# Patient Record
Sex: Male | Born: 1955 | Race: Black or African American | Hispanic: No | Smoking: Never smoker
Health system: Southern US, Community
[De-identification: ages and names within clinical notes are randomized; demographics above are authoritative.]

## PROBLEM LIST (undated history)

## (undated) DIAGNOSIS — I10 Essential (primary) hypertension: Secondary | ICD-10-CM

## (undated) DIAGNOSIS — K429 Umbilical hernia without obstruction or gangrene: Secondary | ICD-10-CM

## (undated) DIAGNOSIS — K4090 Unilateral inguinal hernia, without obstruction or gangrene, not specified as recurrent: Secondary | ICD-10-CM

## (undated) DIAGNOSIS — R112 Nausea with vomiting, unspecified: Secondary | ICD-10-CM

## (undated) DIAGNOSIS — I209 Angina pectoris, unspecified: Secondary | ICD-10-CM

## (undated) DIAGNOSIS — M1990 Unspecified osteoarthritis, unspecified site: Secondary | ICD-10-CM

## (undated) DIAGNOSIS — Z9889 Other specified postprocedural states: Secondary | ICD-10-CM

## (undated) DIAGNOSIS — N200 Calculus of kidney: Secondary | ICD-10-CM

## (undated) DIAGNOSIS — E785 Hyperlipidemia, unspecified: Secondary | ICD-10-CM

## (undated) HISTORY — PX: KNEE SURGERY: SHX244

## (undated) HISTORY — PX: HERNIA REPAIR: SHX51

## (undated) HISTORY — PX: COLONOSCOPY: SHX174

## (undated) HISTORY — PX: OTHER SURGICAL HISTORY: SHX169

## (undated) HISTORY — PX: BUNIONECTOMY: SHX129

---

## 2007-02-24 HISTORY — PX: NM MYOVIEW LTD: HXRAD82

## 2007-03-07 ENCOUNTER — Ambulatory Visit (HOSPITAL_COMMUNITY): Admission: RE | Admit: 2007-03-07 | Discharge: 2007-03-07 | Payer: Self-pay | Admitting: Interventional Cardiology

## 2009-12-24 ENCOUNTER — Emergency Department (HOSPITAL_COMMUNITY)
Admission: EM | Admit: 2009-12-24 | Discharge: 2009-12-24 | Payer: Self-pay | Source: Home / Self Care | Admitting: Emergency Medicine

## 2010-02-21 ENCOUNTER — Emergency Department (HOSPITAL_COMMUNITY)
Admission: EM | Admit: 2010-02-21 | Discharge: 2010-02-21 | Payer: Self-pay | Source: Home / Self Care | Admitting: Emergency Medicine

## 2010-02-21 LAB — CBC
HCT: 40.1 % (ref 39.0–52.0)
Hemoglobin: 12.7 g/dL — ABNORMAL LOW (ref 13.0–17.0)
MCV: 86.6 fL (ref 78.0–100.0)
RDW: 13.6 % (ref 11.5–15.5)
WBC: 10.8 10*3/uL — ABNORMAL HIGH (ref 4.0–10.5)

## 2010-02-21 LAB — PROTIME-INR
INR: 0.94 (ref 0.00–1.49)
Prothrombin Time: 12.8 seconds (ref 11.6–15.2)

## 2010-02-21 LAB — TYPE AND SCREEN: Antibody Screen: NEGATIVE

## 2010-02-21 LAB — DIFFERENTIAL
Basophils Absolute: 0 10*3/uL (ref 0.0–0.1)
Eosinophils Relative: 2 % (ref 0–5)
Lymphocytes Relative: 36 % (ref 12–46)
Lymphs Abs: 3.9 10*3/uL (ref 0.7–4.0)
Neutro Abs: 6.1 10*3/uL (ref 1.7–7.7)

## 2010-02-21 LAB — BASIC METABOLIC PANEL
Chloride: 109 mEq/L (ref 96–112)
GFR calc Af Amer: >60 mL/min (ref >60)
GFR calc non Af Amer: 54 mL/min — ABNORMAL LOW (ref >60)
Potassium: 4 mEq/L (ref 3.5–5.1)
Sodium: 143 mEq/L (ref 135–145)

## 2010-02-21 LAB — OCCULT BLOOD, POC DEVICE: Fecal Occult Bld: POSITIVE

## 2010-02-21 LAB — ABO/RH: ABO/RH(D): O POS

## 2010-04-05 LAB — DIFFERENTIAL
Eosinophils Absolute: 0.3 10*3/uL (ref 0.0–0.7)
Eosinophils Relative: 3 % (ref 0–5)
Lymphocytes Relative: 28 % (ref 12–46)
Lymphs Abs: 2.8 10*3/uL (ref 0.7–4.0)
Monocytes Absolute: 0.6 10*3/uL (ref 0.1–1.0)

## 2010-04-05 LAB — URINALYSIS, ROUTINE W REFLEX MICROSCOPIC
Bilirubin Urine: NEGATIVE
Ketones, ur: NEGATIVE mg/dL
Leukocytes, UA: NEGATIVE
Nitrite: NEGATIVE
Protein, ur: NEGATIVE mg/dL
pH: 5.5 (ref 5.0–8.0)

## 2010-04-05 LAB — CBC
HCT: 40.4 % (ref 39.0–52.0)
MCH: 29.1 pg (ref 26.0–34.0)
MCV: 85.8 fL (ref 78.0–100.0)
RBC: 4.71 MIL/uL (ref 4.22–5.81)
RDW: 13.6 % (ref 11.5–15.5)
WBC: 9.9 10*3/uL (ref 4.0–10.5)

## 2010-04-05 LAB — URINE MICROSCOPIC-ADD ON

## 2010-04-05 LAB — BASIC METABOLIC PANEL
BUN: 19 mg/dL (ref 6–23)
CO2: 26 mEq/L (ref 19–32)
Chloride: 110 mEq/L (ref 96–112)
Creatinine, Ser: 1.74 mg/dL — ABNORMAL HIGH (ref 0.4–1.5)
Potassium: 4.3 mEq/L (ref 3.5–5.1)

## 2014-09-25 ENCOUNTER — Ambulatory Visit: Payer: Self-pay | Admitting: Physician Assistant

## 2014-09-25 NOTE — H&P (Signed)
TOTAL KNEE ADMISSION H&P  Patient is being admitted for right total knee arthroplasty.  Subjective:  Chief Complaint:right knee pain.  HPI: David Stephens, 59 y.o. male, has a history of pain and functional disability in the right knee due to arthritis and has failed non-surgical conservative treatments for greater than 12 weeks to includeNSAID's and/or analgesics, corticosteriod injections and activity modification.  Onset of symptoms was gradual, starting >10 years ago with gradually worsening course since that time. The patient noted prior procedures on the knee to include  arthroscopy, menisectomy and ACL reconstruction on the right knee(s).  Patient currently rates pain in the right knee(s) at 8 out of 10 with activity. Patient has night pain, worsening of pain with activity and weight bearing, pain that interferes with activities of daily living, pain with passive range of motion, crepitus and joint swelling.  Patient has evidence of periarticular osteophytes and joint space narrowing by imaging studies.  There is no active infection.  There are no active problems to display for this patient.  No past medical history on file.  No past surgical history on file.   (Not in a hospital admission) Allergies not on file  Social History  Substance Use Topics  . Smoking status: Not on file  . Smokeless tobacco: Not on file  . Alcohol Use: Not on file    No family history on file.   Review of Systems  Musculoskeletal: Positive for joint pain. Negative for falls.  All other systems reviewed and are negative.   Objective:  Physical Exam  Constitutional: He is oriented to person, place, and time. He appears well-developed and well-nourished. No distress.  HENT:  Head: Normocephalic and atraumatic.  Nose: Nose normal.  Eyes: Conjunctivae and EOM are normal. Pupils are equal, round, and reactive to light.  Neck: Normal range of motion. Neck supple.  Cardiovascular: Normal rate,  regular rhythm, normal heart sounds and intact distal pulses.   Respiratory: Effort normal and breath sounds normal. No respiratory distress. He has no wheezes. He has no rales. He exhibits no tenderness.  GI: Soft. Bowel sounds are normal. He exhibits no distension. There is no tenderness.  Musculoskeletal:       Right knee: He exhibits decreased range of motion. He exhibits no LCL laxity and no MCL laxity. Tenderness found.  Lymphadenopathy:    He has no cervical adenopathy.  Neurological: He is alert and oriented to person, place, and time. No cranial nerve deficit.  Skin: Skin is warm and dry. No rash noted. No erythema.  Psychiatric: He has a normal mood and affect. His behavior is normal.    Vital signs in last 24 hours: @  Labs:   There is no height or weight on file to calculate BMI.   Imaging Review Plain radiographs demonstrate severe degenerative joint disease of the right knee(s). The overall alignment isneutral. The bone quality appears to be good for age and reported activity level. Staples proximal tibia and distal femur from previous ACL.  Assessment/Plan:  End stage arthritis, right knee   The patient history, physical examination, clinical judgment of the provider and imaging studies are consistent with end stage degenerative joint disease of the right knee(s) and total knee arthroplasty is deemed medically necessary. The treatment options including medical management, injection therapy arthroscopy and arthroplasty were discussed at length. The risks and benefits of total knee arthroplasty were presented and reviewed. The risks due to aseptic loosening, infection, stiffness, patella tracking problems, thromboembolic complications and other  imponderables were discussed. The patient acknowledged the explanation, agreed to proceed with the plan and consent was signed. Patient is being admitted for inpatient treatment for surgery, pain control, PT, OT, prophylactic  antibiotics, VTE prophylaxis, progressive ambulation and ADL's and discharge planning. The patient is planning to be discharged home with home health services

## 2014-09-29 ENCOUNTER — Encounter (HOSPITAL_COMMUNITY): Payer: Self-pay

## 2014-09-29 ENCOUNTER — Encounter (HOSPITAL_COMMUNITY)
Admission: RE | Admit: 2014-09-29 | Discharge: 2014-09-29 | Disposition: A | Discharge status: Home / Self Care | Payer: 59 | Source: Ambulatory Visit | Attending: Orthopedic Surgery | Admitting: Orthopedic Surgery

## 2014-09-29 ENCOUNTER — Ambulatory Visit (HOSPITAL_COMMUNITY)
Admission: RE | Admit: 2014-09-29 | Discharge: 2014-09-29 | Disposition: A | Discharge status: Home / Self Care | Payer: 59 | Source: Ambulatory Visit | Attending: Physician Assistant | Admitting: Physician Assistant

## 2014-09-29 DIAGNOSIS — Z01818 Encounter for other preprocedural examination: Secondary | ICD-10-CM | POA: Insufficient documentation

## 2014-09-29 DIAGNOSIS — M1711 Unilateral primary osteoarthritis, right knee: Secondary | ICD-10-CM

## 2014-09-29 DIAGNOSIS — Z01812 Encounter for preprocedural laboratory examination: Secondary | ICD-10-CM | POA: Diagnosis not present

## 2014-09-29 HISTORY — DX: Unspecified osteoarthritis, unspecified site: M19.90

## 2014-09-29 HISTORY — DX: Calculus of kidney: N20.0

## 2014-09-29 HISTORY — DX: Umbilical hernia without obstruction or gangrene: K42.9

## 2014-09-29 HISTORY — DX: Hyperlipidemia, unspecified: E78.5

## 2014-09-29 HISTORY — DX: Other specified postprocedural states: R11.2

## 2014-09-29 HISTORY — DX: Unilateral inguinal hernia, without obstruction or gangrene, not specified as recurrent: K40.90

## 2014-09-29 HISTORY — DX: Nausea with vomiting, unspecified: Z98.890

## 2014-09-29 HISTORY — DX: Angina pectoris, unspecified: I20.9

## 2014-09-29 LAB — COMPREHENSIVE METABOLIC PANEL
ALK PHOS: 75 U/L (ref 38–126)
ALT: 21 U/L (ref 17–63)
AST: 23 U/L (ref 15–41)
Albumin: 4 g/dL (ref 3.5–5.0)
Anion gap: 6 (ref 5–15)
BUN: 21 mg/dL — ABNORMAL HIGH (ref 6–20)
CALCIUM: 9.4 mg/dL (ref 8.9–10.3)
CO2: 23 mmol/L (ref 22–32)
CREATININE: 1.67 mg/dL — AB (ref 0.61–1.24)
Chloride: 111 mmol/L (ref 101–111)
GFR calc non Af Amer: 43 mL/min — ABNORMAL LOW (ref >60)
GFR, EST AFRICAN AMERICAN: 50 mL/min — AB (ref >60)
GLUCOSE: 94 mg/dL (ref 65–99)
Potassium: 3.8 mmol/L (ref 3.5–5.1)
SODIUM: 140 mmol/L (ref 135–145)
Total Bilirubin: 0.6 mg/dL (ref 0.3–1.2)
Total Protein: 7.5 g/dL (ref 6.5–8.1)

## 2014-09-29 LAB — PROTIME-INR
INR: 1.1 (ref 0.00–1.49)
Prothrombin Time: 14.4 seconds (ref 11.6–15.2)

## 2014-09-29 LAB — CBC WITH DIFFERENTIAL/PLATELET
Basophils Absolute: 0 10*3/uL (ref 0.0–0.1)
Basophils Relative: 0 % (ref 0–1)
EOS ABS: 0.2 10*3/uL (ref 0.0–0.7)
Eosinophils Relative: 3 % (ref 0–5)
HCT: 42.5 % (ref 39.0–52.0)
HEMOGLOBIN: 14.2 g/dL (ref 13.0–17.0)
LYMPHS ABS: 2.2 10*3/uL (ref 0.7–4.0)
LYMPHS PCT: 29 % (ref 12–46)
MCH: 29.1 pg (ref 26.0–34.0)
MCHC: 33.4 g/dL (ref 30.0–36.0)
MCV: 87.1 fL (ref 78.0–100.0)
Monocytes Absolute: 0.4 10*3/uL (ref 0.1–1.0)
Monocytes Relative: 5 % (ref 3–12)
NEUTROS ABS: 4.8 10*3/uL (ref 1.7–7.7)
NEUTROS PCT: 63 % (ref 43–77)
Platelets: 158 10*3/uL (ref 150–400)
RBC: 4.88 MIL/uL (ref 4.22–5.81)
RDW: 13.6 % (ref 11.5–15.5)
WBC: 7.7 10*3/uL (ref 4.0–10.5)

## 2014-09-29 LAB — URINALYSIS, ROUTINE W REFLEX MICROSCOPIC
Bilirubin Urine: NEGATIVE
Glucose, UA: NEGATIVE mg/dL
Hgb urine dipstick: NEGATIVE
Ketones, ur: NEGATIVE mg/dL
LEUKOCYTES UA: NEGATIVE
NITRITE: NEGATIVE
PROTEIN: NEGATIVE mg/dL
Specific Gravity, Urine: 1.019 (ref 1.005–1.030)
UROBILINOGEN UA: 0.2 mg/dL (ref 0.0–1.0)
pH: 5 (ref 5.0–8.0)

## 2014-09-29 LAB — TYPE AND SCREEN
ABO/RH(D): O POS
Antibody Screen: NEGATIVE

## 2014-09-29 LAB — APTT: aPTT: 33 seconds (ref 24–37)

## 2014-09-29 LAB — SURGICAL PCR SCREEN
MRSA, PCR: NEGATIVE
STAPHYLOCOCCUS AUREUS: NEGATIVE

## 2014-09-29 NOTE — Progress Notes (Signed)
Patient's PCP is [facility] off [address]  Patient denies currently seeing a cardiologist.  EKG: requested from [facility] (2 weeks ago per pt)  ECHO: denies  Stress test:03/07/07  Cath: denies

## 2014-09-29 NOTE — Pre-Procedure Instructions (Signed)
    David Stephens  09/29/2014     No Pharmacies Listed   Your procedure is scheduled on Friday October 09 2014 at 1045 a.m.  Report to [facility] Admitting at 845 A.M.  Call this number if you have problems the morning of surgery:  [phone]  Call this number if you have problems in the days leading up to your surgery:  [phone]    Remember:  Do not eat food or drink liquids after midnight Thursday October 08 2014.  Take these medicines the morning of surgery with A SIP OF WATER   Do not wear jewelry, make-up or nail polish.  Do not wear lotions, powders, or perfumes.  You may wear deodorant.  Do not shave 48 hours prior to surgery.  Men may shave face and neck.  Do not bring valuables to the hospital.  [facility] is not responsible for any belongings or valuables.  Contacts, dentures or bridgework may not be worn into surgery.  Leave your suitcase in the car.  After surgery it may be brought to your room.  For patients admitted to the hospital, discharge time will be determined by your treatment team.  Patients discharged the day of surgery will not be allowed to drive home.   Special instructions:  Please follow these instructions carefully:  1. Shower with CHG Soap the night before surgery and the morning of Surgery. 2. If you choose to wash your hair, wash your hair first as usual with your normal shampoo. 3. After you shampoo, rinse your hair and body thoroughly to remove the Shampoo. 4. Use CHG as you would any other liquid soap. You can apply chg directly to the skin and wash gently with scrungie or a clean washcloth. 5. Apply the CHG Soap to your body ONLY FROM THE NECK DOWN. Do not use on open wounds or open sores. Avoid contact with your eyes, ears, mouth and genitals (private parts). Wash genitals (private parts) with your normal soap. 6. Wash thoroughly, paying  special attention to the area where your surgery will be performed. 7. Thoroughly rinse your body with warm water from the neck down. 8. DO NOT shower/wash with your normal soap after using and rinsing off the CHG Soap. 9. Pat yourself dry with a clean towel.  10. Wear clean pajamas.  11. Place clean sheets on your bed the night of your first shower and do not sleep with pets.  Day of Surgery  Do not apply any lotions/deodorants the morning of surgery. Please wear clean clothes to the hospital/surgery center.    Please read over the following fact sheets that you were given. Pain Booklet, Coughing and Deep Breathing, Blood Transfusion Information, MRSA Information and Surgical Site Infection Prevention

## 2014-09-30 LAB — URINE CULTURE: Culture: 20000

## 2014-09-30 NOTE — Progress Notes (Addendum)
Anesthesia Chart Review:  Pt is 59 year old male scheduled for R total knee arthroplasty, hardware removal on 10/09/2014 with Dr. Madelon Lips.   PCP is Dr. Catha Gosselin.   PMH includes: hyperlipidemia. Never smoker. BMI 37.   Anesthesia history includes post-op N/V.  No medications listed.   Preoperative labs reviewed.  Cr 1.67, BUN 21.  Chest x-ray 09/29/2014 reviewed. No active cardiopulmonary disease.   EKG 09/03/2014: sinus bradycardia (52 bpm). Nonspecific T wave abnormality.   Nuclear stress test 03/07/2007: 1. No areas of reversibility to suggest inducible ischemia. 2. Mild global hypokinesis. 3. Ejection fraction estimated at 47%.  Pt has medical clearance from Dr. Clarene Duke for surgery.   Spoke with Dr. Renold Don. Pt will need cardiac clearance prior to surgery. Left message for [facility] in Dr. Candise Bowens office with this info. Have faxed copy of BMET results to Dr. Clarene Duke for review. Will get I-stat 8 DOS.   Rica Mast, FNP-BC [facility]/Anesthesiology Phone: [phone] 09/30/2014 4:53 PM  Addendum:  Echo 10/05/2014:  - Left ventricle: The cavity size was normal. Wall thickness was increased in a pattern of moderate LVH. Systolic function was normal. The estimated ejection fraction was in the range of 60% to 65%. - Aortic valve: There was trivial regurgitation. - Mitral valve: There was mild regurgitation. - Left atrium: The atrium was mildly to moderately dilated.  Pt saw Dr. Herbie Baltimore for pre-op evaluation 10/02/14. Dr. Herbie Baltimore felt stress testing not necessary prior to surgery, but did order an echo (results above). Based on echo results, Dr. Herbie Baltimore did not feel any further testing was needing prior to surgery.   Dr. Clarene Duke reviewed BMET and commented "would hold all NSAIDs with elevated creatinine seen".  I have attempted to call pt to relay this message but pt does not answer and voice mail box is full. Spoke with Tresa Endo in Dr. Candise Bowens office. Pt was  told to stop NSAIDs 10 days prior to surgery. Will recheck I-stat 8 DOS.   If labs acceptable DOS, I anticipate pt can proceed as scheduled.   Rica Mast, FNP-BC [facility]/Anesthesiology Phone: [phone] 10/06/2014 4:20 PM

## 2014-10-01 ENCOUNTER — Telehealth: Payer: Self-pay | Admitting: Cardiology

## 2014-10-01 NOTE — Telephone Encounter (Signed)
Received records from [facility] for appointment on 10/02/14 with Dr Herbie Baltimore.  Records given to [facility] (medical records) for Dr Elissa Hefty schedule on 10/02/14. lp

## 2014-10-02 ENCOUNTER — Encounter: Payer: Self-pay | Admitting: Cardiology

## 2014-10-02 ENCOUNTER — Ambulatory Visit (INDEPENDENT_AMBULATORY_CARE_PROVIDER_SITE_OTHER): Payer: 59 | Admitting: Cardiology

## 2014-10-02 VITALS — BP 170/80 | HR 60 | Ht 72.0 in | Wt 270.3 lb

## 2014-10-02 DIAGNOSIS — I10 Essential (primary) hypertension: Secondary | ICD-10-CM | POA: Diagnosis not present

## 2014-10-02 DIAGNOSIS — R931 Abnormal findings on diagnostic imaging of heart and coronary circulation: Secondary | ICD-10-CM

## 2014-10-02 DIAGNOSIS — Z01810 Encounter for preprocedural cardiovascular examination: Secondary | ICD-10-CM

## 2014-10-02 NOTE — Progress Notes (Signed)
PATIENT: David Stephens MRN: [id number] DOB: August 31, 1955 PCP: No primary care provider on file.  Clinic Note: Chief Complaint  Patient presents with  . Follow-up    No cardiac complaints.  Needs Right knee replacement Dr. Madelon Lips 10/09/2014.  Anesthesiologist needs him to have a stress test prior to the surgery.    . Pre-op Exam    HPI: David Stephens is a 59 y.o. male with a PMH below who presents today for Pre-Operative Risk Assessment for upcoming knee Sgx.    Nuclear stress test 03/07/2007: 1. No areas of reversibility to suggest inducible ischemia. 2. Mild global hypokinesis. 3. Ejection fraction estimated at 47%.  Interval History: David Stephens presents for urgent evaluation following preoperative visit with anesthesiology for knee surgery.he apparently has had several operations or procedures on his knee knee in the past with Dr. Madelon Lips and he is scheduled for right knee total arthroplasty and hardware removal on September 16. He is a nonsmoker with moderate obesity and probably hypertension. In the past he been on a medicine for cholesterol but not currently now. His renal function is mildly decreased but not significantly creatinine of 1.67. He is become less active with less exercise over the last couple years due to progressively worsening knee discomfort. Upper 4 years ago he was walking for 5 miles a day without issues. Doing routine walking is usually okay with him but carrying weights or any lateral movement is very painful for him. Apparently in 2009 he presented with a sharp chest pain episode to the emergency room and was evaluated with a nuclear stress test. This test was read as no area of reversibility to suggest ischemia but there was a suggestion of nonspecific T-wave abnormalities and mild global hypokinesis with EF of 47%. Based on this as far as I can tell, he was referred for cardiology evaluation. The patient has not had any active cardiac symptoms with either rest or  exertion. No chest tightness or pressure or even dyspnea at rest or with exertion. No PND, orthopnea or edema. No rapid irregular heartbeat/palpitations or arrhythmias. No CP/near syncope TIA/amaurosis fugax.  Overall, his exercise level is diminished on knee pain but he says that he could probably walk without any difficulty.  Past Medical History  Diagnosis Date  . PONV (postoperative nausea and vomiting)   . Anginal pain   . Hyperlipidemia     patient states he takes medication for this but ran out and has stopped it.  . Kidney stones   . Inguinal hernia   . Umbilical hernia   . Arthritis     Prior Cardiac Evaluation and Past Surgical History: Past Surgical History  Procedure Laterality Date  . Hernia repair    . Knee surgery Bilateral     several  . Bunionectomy    . Multiple tooth extraction    . Colonoscopy    . Nm myoview ltd  February 2009    No reversal perfusion defect to suggest ischemia. No infarct. Mild global hypokinesis with EF 47%.    Allergies  Allergen Reactions  . Clindamycin/Lincomycin Diarrhea    Current Outpatient Prescriptions  Medication Sig Dispense Refill  . ampicillin (PRINCIPEN) 500 MG capsule Take 1 capsule by mouth 4 (four) times daily.     No current facility-administered medications for this visit.    Social History  Substance Use Topics  . Smoking status: Never Smoker   . Smokeless tobacco: None  . Alcohol Use: No   family history is not  on file. - per his report, no family history of premature coronary disease or diabetes  ROS: A comprehensive Review of Systems - was performed Review of Systems  Constitutional: Negative for malaise/fatigue.  Cardiovascular: Negative.  Negative for claudication.       Per history of present illness  Gastrointestinal: Negative for blood in stool and melena.  Musculoskeletal: Positive for joint pain (Right knee very painful.).  Neurological: Negative for dizziness.  Endo/Heme/Allergies: Does not  bruise/bleed easily.  Psychiatric/Behavioral: Negative.   All other systems reviewed and are negative.   PHYSICAL EXAM BP 170/80 mmHg  Pulse 60  Ht 6' (1.829 m)  Wt 270 lb 4.8 oz (122.607 kg)  BMI 36.65 kg/m2 General appearance: alert, cooperative, appears stated age, no distress, moderately obese and Otherwise healthy-appearing Neck: no adenopathy, no carotid bruit, no JVD and supple, symmetrical, trachea midline Lungs: clear to auscultation bilaterally, normal percussion bilaterally and Nonlabored, good air movement Heart: normal apical impulse, regular rate and rhythm, S1, S2 normal, no S3 or S4 and Soft systolic ejection murmur heard at right sternal border. Nondisplaced PMI. Abdomen: soft, non-tender; bowel sounds normal; no masses,  no organomegaly and Mild obesity Extremities: extremities normal, atraumatic, no cyanosis or edema Pulses: 2+ and symmetric Skin: Skin color, texture, turgor normal. No rashes or lesions Neurologic: Alert and oriented X 3, normal strength and tone. Normal symmetric reflexes. Normal coordination and gait   Adult ECG Report  Rate: 54 ;  Rhythm: sinus bradycardia, nonspecific ST and T-wave changes which have the appearance of the repolarization abnormality.  QRS Axis: 24 ;  PR Interval: 168 ;  QRS Duration: 100 ; QTc: 413; Voltages: normal    Narrative Interpretation: relatively normal EKG with nonspecific changes  Recent Labs:  Lab Results  Component Value Date   CREATININE 1.67* 09/29/2014   Lab Results  Component Value Date   K 3.8 09/29/2014    ASSESSMENT / PLAN: Relatively healthy 59 year old gentleman with likely hypertension and hyperlipidemia but no other previous diagnoses cardiac risk factors.  He is referred for her preoperative risk assessment based upon his Myoview in 2009 showing mild global kinesis.  Problem List Items Addressed This Visit    Decreased cardiac ejection fraction    EF 47% on Myoview is not the most accurate way  of assessing EF. He does have a slight murmur based on that, as well as abnormal EKG but has some findings for LVH and the patient with what appeared to be poorly controlled hypertension, Will check a 2-D echocardiogram.      Relevant Orders   ECHOCARDIOGRAM COMPLETE   EKG 12-Lead   Essential hypertension (Chronic)    His blood pressures pre-day. He tells me it is usually not this high at home. During his preop check his blood pressure is not decide. He is a bit anxious about being here as well as being somewhat frustrated. Unfortunately I don't have a lot of time in order to adequately address this issue prior to surgery. It would be nice to have him on beta blocker for risk reduction, however starting at this supine operation is not likely to be beneficial.      Pre-operative cardiovascular examination - Primary    PREOPERATIVE CARDIAC RISK ASSESSMENT   Revised Cardiac Risk Index:  High Risk Surgery: no; TKA  Defined as Intraperitoneal, intrathoracic or suprainguinal vascular  Active CAD: no; Has never had angina  CHF: no;   Cerebrovascular Disease: no; No CVA or TIA  Diabetes: no; On  Insulin: no  CKD (Cr >~ 2): no; Cr 1.67  Total: 0 Estimated Risk of Adverse Outcome: LOW RISK for Low Risk Surgery  Estimated Risk of MI, PE, VF/VT (Cardiac Arrest), Complete Heart Block: < 1%   ACC/AHA Guidelines for "Clearance":  Step 1 - Need for Emergency Surgery: No:   If Yes - go straight to OR with perioperative surveillance  Step 2 - Active Cardiac Conditions (Unstable Angina, Decompensated HF, Significant  Arrhytmias - Complete HB, Mobitz II, Symptomatic VT or SVT, Severe Aortic Stenosis - mean gradient > 40 mmHg, Valve area < 1.0 cm2):   No: No active Sx.  If Yes - Evaluate & Treat per ACC/AHA Guidelines  Step 3 -  Low Risk Surgery: Yes -- Knee Surgery  If Yes --> proceed to OR  If No --> Step 4  Step 4 - Functional Capacity >= 4 METS without symptoms: Yes  If Yes -->  proceed to OR  If No --> Step 5  Step 5 --  Clinical Risk Factors (CRF)  - listed above  3 or more: No:   No CRFs: Yes  If Yes --> Proceed to OR  Based on these recommendations, in the absence of a numbing and the active symptoms, I do not think that he needs a stress test prior to his surgery. Her belly data would suggest that in the absence of active cardiac symptoms there is no need for further evaluation. He does have a slightly abnormal EKG and a murmur so we will check echocardiogram. He had a stress test was nonischemic 2009 has not had any symptoms since then. There is no indication for stress testing at this time.      Relevant Orders   ECHOCARDIOGRAM COMPLETE   EKG 12-Lead      Meds ordered this encounter  Medications  . ampicillin (PRINCIPEN) 500 MG capsule    Sig: Take 1 capsule by mouth 4 (four) times daily.    Followup: PRN - or if Echo is abnormal.    Caitlyn Buchanan W. Herbie Baltimore, M.D., M.S. Interventional Cardiolgy [facility]

## 2014-10-02 NOTE — Patient Instructions (Addendum)
Your physician has requested that you have an echocardiogram. Echocardiography is a painless test that uses sound waves to create images of your heart. It provides your doctor with information about the size and shape of your heart and how well your heart's chambers and valves are working. This procedure takes approximately one hour. There are no restrictions for this procedure.  WILL CONTACT YOU WITH RESULTS.  Your physician recommends that you schedule a follow-up appointment ON AS NEEDED BASIS.

## 2014-10-04 ENCOUNTER — Encounter: Payer: Self-pay | Admitting: Cardiology

## 2014-10-04 DIAGNOSIS — I10 Essential (primary) hypertension: Secondary | ICD-10-CM | POA: Insufficient documentation

## 2014-10-04 DIAGNOSIS — R931 Abnormal findings on diagnostic imaging of heart and coronary circulation: Secondary | ICD-10-CM | POA: Insufficient documentation

## 2014-10-04 DIAGNOSIS — Z01810 Encounter for preprocedural cardiovascular examination: Secondary | ICD-10-CM | POA: Insufficient documentation

## 2014-10-04 NOTE — Assessment & Plan Note (Signed)
His blood pressures pre-day. He tells me it is usually not this high at home. During his preop check his blood pressure is not decide. He is a bit anxious about being here as well as being somewhat frustrated. Unfortunately I don't have a lot of time in order to adequately address this issue prior to surgery. It would be nice to have him on beta blocker for risk reduction, however starting at this supine operation is not likely to be beneficial.

## 2014-10-04 NOTE — Assessment & Plan Note (Signed)
PREOPERATIVE CARDIAC RISK ASSESSMENT   Revised Cardiac Risk Index:  High Risk Surgery: no; TKA  Defined as Intraperitoneal, intrathoracic or suprainguinal vascular  Active CAD: no; Has never had angina  CHF: no;   Cerebrovascular Disease: no; No CVA or TIA  Diabetes: no; On Insulin: no  CKD (Cr >~ 2): no; Cr 1.67  Total: 0 Estimated Risk of Adverse Outcome: LOW RISK for Low Risk Surgery  Estimated Risk of MI, PE, VF/VT (Cardiac Arrest), Complete Heart Block: < 1%   ACC/AHA Guidelines for "Clearance":  Step 1 - Need for Emergency Surgery: No:   If Yes - go straight to OR with perioperative surveillance  Step 2 - Active Cardiac Conditions (Unstable Angina, Decompensated HF, Significant  Arrhytmias - Complete HB, Mobitz II, Symptomatic VT or SVT, Severe Aortic Stenosis - mean gradient > 40 mmHg, Valve area < 1.0 cm2):   No: No active Sx.  If Yes - Evaluate & Treat per ACC/AHA Guidelines  Step 3 -  Low Risk Surgery: Yes -- Knee Surgery  If Yes --> proceed to OR  If No --> Step 4  Step 4 - Functional Capacity >= 4 METS without symptoms: Yes  If Yes --> proceed to OR  If No --> Step 5  Step 5 --  Clinical Risk Factors (CRF)  - listed above  3 or more: No:   No CRFs: Yes  If Yes --> Proceed to OR  Based on these recommendations, in the absence of a numbing and the active symptoms, I do not think that he needs a stress test prior to his surgery. Her belly data would suggest that in the absence of active cardiac symptoms there is no need for further evaluation. He does have a slightly abnormal EKG and a murmur so we will check echocardiogram. He had a stress test was nonischemic 2009 has not had any symptoms since then. There is no indication for stress testing at this time.

## 2014-10-04 NOTE — Assessment & Plan Note (Signed)
EF 47% on Myoview is not the most accurate way of assessing EF. He does have a slight murmur based on that, as well as abnormal EKG but has some findings for LVH and the patient with what appeared to be poorly controlled hypertension, Will check a 2-D echocardiogram.

## 2014-10-05 ENCOUNTER — Ambulatory Visit (HOSPITAL_COMMUNITY): Payer: 59 | Attending: Cardiovascular Disease

## 2014-10-05 ENCOUNTER — Other Ambulatory Visit: Payer: Self-pay

## 2014-10-05 ENCOUNTER — Ambulatory Visit: Payer: 59 | Admitting: Nurse Practitioner

## 2014-10-05 DIAGNOSIS — I517 Cardiomegaly: Secondary | ICD-10-CM | POA: Insufficient documentation

## 2014-10-05 DIAGNOSIS — I10 Essential (primary) hypertension: Secondary | ICD-10-CM | POA: Diagnosis not present

## 2014-10-05 DIAGNOSIS — E785 Hyperlipidemia, unspecified: Secondary | ICD-10-CM | POA: Insufficient documentation

## 2014-10-05 DIAGNOSIS — I340 Nonrheumatic mitral (valve) insufficiency: Secondary | ICD-10-CM | POA: Insufficient documentation

## 2014-10-05 DIAGNOSIS — R931 Abnormal findings on diagnostic imaging of heart and coronary circulation: Secondary | ICD-10-CM

## 2014-10-05 DIAGNOSIS — Z01818 Encounter for other preprocedural examination: Secondary | ICD-10-CM | POA: Diagnosis present

## 2014-10-05 DIAGNOSIS — I351 Nonrheumatic aortic (valve) insufficiency: Secondary | ICD-10-CM | POA: Insufficient documentation

## 2014-10-05 DIAGNOSIS — Z01810 Encounter for preprocedural cardiovascular examination: Secondary | ICD-10-CM | POA: Diagnosis not present

## 2014-10-06 ENCOUNTER — Ambulatory Visit: Payer: 59 | Admitting: Physician Assistant

## 2014-10-08 ENCOUNTER — Ambulatory Visit: Payer: Self-pay | Admitting: Physician Assistant

## 2014-10-08 MED ORDER — DEXTROSE 5 % IV SOLN
3.0000 g | INTRAVENOUS | Status: AC
  Administered 2014-10-09: 3 g via INTRAVENOUS
  Filled 2014-10-08: qty 3000

## 2014-10-08 MED ORDER — TRANEXAMIC ACID 1000 MG/10ML IV SOLN
1000.0000 mg | INTRAVENOUS | Status: AC
  Administered 2014-10-09: 1000 mg via INTRAVENOUS
  Filled 2014-10-08: qty 10

## 2014-10-08 MED ORDER — SODIUM CHLORIDE 0.9 % IV SOLN
1500.0000 mg | INTRAVENOUS | Status: AC
  Administered 2014-10-09: 1000 mg via INTRAVENOUS
  Filled 2014-10-08: qty 1500

## 2014-10-08 NOTE — Progress Notes (Signed)
Message left on patient voice mail regarding change in surgery time,instructed to call  [phone] to let us know he received the message.

## 2014-10-09 ENCOUNTER — Encounter (HOSPITAL_COMMUNITY)
Admission: RE | Disposition: A | Discharge status: Home Health | Payer: Self-pay | Source: Ambulatory Visit | Attending: Orthopedic Surgery

## 2014-10-09 ENCOUNTER — Inpatient Hospital Stay (HOSPITAL_COMMUNITY): Payer: 59 | Admitting: Emergency Medicine

## 2014-10-09 ENCOUNTER — Encounter (HOSPITAL_COMMUNITY): Payer: Self-pay | Admitting: *Deleted

## 2014-10-09 ENCOUNTER — Inpatient Hospital Stay (HOSPITAL_COMMUNITY): Payer: 59 | Admitting: Certified Registered Nurse Anesthetist

## 2014-10-09 ENCOUNTER — Inpatient Hospital Stay (HOSPITAL_COMMUNITY)
Admission: RE | Admit: 2014-10-09 | Discharge: 2014-10-12 | DRG: 470 | Disposition: A | Discharge status: Home Health | Payer: 59 | Source: Ambulatory Visit | Attending: Orthopedic Surgery | Admitting: Orthopedic Surgery

## 2014-10-09 DIAGNOSIS — M1711 Unilateral primary osteoarthritis, right knee: Secondary | ICD-10-CM | POA: Diagnosis present

## 2014-10-09 DIAGNOSIS — I10 Essential (primary) hypertension: Secondary | ICD-10-CM | POA: Diagnosis present

## 2014-10-09 DIAGNOSIS — Z6836 Body mass index (BMI) 36.0-36.9, adult: Secondary | ICD-10-CM

## 2014-10-09 DIAGNOSIS — E669 Obesity, unspecified: Secondary | ICD-10-CM | POA: Diagnosis present

## 2014-10-09 DIAGNOSIS — R509 Fever, unspecified: Secondary | ICD-10-CM

## 2014-10-09 HISTORY — PX: TOTAL KNEE ARTHROPLASTY: SHX125

## 2014-10-09 LAB — CBC
HEMATOCRIT: 39.7 % (ref 39.0–52.0)
HEMOGLOBIN: 13 g/dL (ref 13.0–17.0)
MCH: 28.6 pg (ref 26.0–34.0)
MCHC: 32.7 g/dL (ref 30.0–36.0)
MCV: 87.3 fL (ref 78.0–100.0)
Platelets: 150 10*3/uL (ref 150–400)
RBC: 4.55 MIL/uL (ref 4.22–5.81)
RDW: 13.5 % (ref 11.5–15.5)
WBC: 13.3 10*3/uL — ABNORMAL HIGH (ref 4.0–10.5)

## 2014-10-09 LAB — CREATININE, SERUM
CREATININE: 1.56 mg/dL — AB (ref 0.61–1.24)
GFR calc Af Amer: 54 mL/min — ABNORMAL LOW (ref >60)
GFR, EST NON AFRICAN AMERICAN: 47 mL/min — AB (ref >60)

## 2014-10-09 LAB — POCT I-STAT, CHEM 8
BUN: 20 mg/dL (ref 6–20)
CALCIUM ION: 1.23 mmol/L (ref 1.12–1.23)
CHLORIDE: 107 mmol/L (ref 101–111)
Creatinine, Ser: 1.7 mg/dL — ABNORMAL HIGH (ref 0.61–1.24)
GLUCOSE: 99 mg/dL (ref 65–99)
HCT: 40 % (ref 39.0–52.0)
Hemoglobin: 13.6 g/dL (ref 13.0–17.0)
Potassium: 4 mmol/L (ref 3.5–5.1)
SODIUM: 143 mmol/L (ref 135–145)
TCO2: 24 mmol/L (ref 0–100)

## 2014-10-09 SURGERY — ARTHROPLASTY, KNEE, TOTAL
Anesthesia: Regional | Laterality: Right

## 2014-10-09 MED ORDER — PHENOL 1.4 % MT LIQD: 1.0000 | OROMUCOSAL | Status: DC | PRN

## 2014-10-09 MED ORDER — HYDROMORPHONE HCL 1 MG/ML IJ SOLN
INTRAMUSCULAR | Status: AC
  Administered 2014-10-09: 0.5 mg via INTRAVENOUS
  Filled 2014-10-09: qty 1

## 2014-10-09 MED ORDER — PROPOFOL 10 MG/ML IV BOLUS
INTRAVENOUS | Status: AC
  Filled 2014-10-09: qty 20

## 2014-10-09 MED ORDER — CHLORHEXIDINE GLUCONATE 4 % EX LIQD: 60.0000 mL | Freq: Once | CUTANEOUS | Status: DC

## 2014-10-09 MED ORDER — MIDAZOLAM HCL 2 MG/2ML IJ SOLN
INTRAMUSCULAR | Status: AC
  Filled 2014-10-09: qty 4

## 2014-10-09 MED ORDER — BISACODYL 10 MG RE SUPP: 10.0000 mg | Freq: Every day | RECTAL | Status: DC | PRN

## 2014-10-09 MED ORDER — METOCLOPRAMIDE HCL 5 MG/ML IJ SOLN: 5.0000 mg | Freq: Three times a day (TID) | INTRAMUSCULAR | Status: DC | PRN

## 2014-10-09 MED ORDER — EPHEDRINE SULFATE 50 MG/ML IJ SOLN
INTRAMUSCULAR | Status: AC
  Filled 2014-10-09: qty 1

## 2014-10-09 MED ORDER — ONDANSETRON HCL 4 MG/2ML IJ SOLN
4.0000 mg | Freq: Four times a day (QID) | INTRAMUSCULAR | Status: DC | PRN
  Filled 2014-10-09: qty 2

## 2014-10-09 MED ORDER — NEOSTIGMINE METHYLSULFATE 10 MG/10ML IV SOLN
INTRAVENOUS | Status: AC
  Filled 2014-10-09: qty 1

## 2014-10-09 MED ORDER — DEXAMETHASONE SODIUM PHOSPHATE 4 MG/ML IJ SOLN
INTRAMUSCULAR | Status: AC
  Filled 2014-10-09: qty 2

## 2014-10-09 MED ORDER — VANCOMYCIN HCL IN DEXTROSE 1-5 GM/200ML-% IV SOLN
1000.0000 mg | Freq: Two times a day (BID) | INTRAVENOUS | Status: AC
  Administered 2014-10-09: 1000 mg via INTRAVENOUS
  Filled 2014-10-09: qty 200

## 2014-10-09 MED ORDER — ONDANSETRON HCL 4 MG PO TABS: 4.0000 mg | ORAL_TABLET | Freq: Four times a day (QID) | ORAL | Status: DC | PRN

## 2014-10-09 MED ORDER — FENTANYL CITRATE (PF) 250 MCG/5ML IJ SOLN
INTRAMUSCULAR | Status: AC
  Filled 2014-10-09: qty 5

## 2014-10-09 MED ORDER — ALUM & MAG HYDROXIDE-SIMETH 200-200-20 MG/5ML PO SUSP: 30.0000 mL | ORAL | Status: DC | PRN

## 2014-10-09 MED ORDER — SODIUM CHLORIDE 0.9 % IV SOLN: INTRAVENOUS | Status: DC

## 2014-10-09 MED ORDER — ENOXAPARIN SODIUM 30 MG/0.3ML ~~LOC~~ SOLN
30.0000 mg | Freq: Two times a day (BID) | SUBCUTANEOUS | Status: DC
Start: 2014-10-10 — End: 2014-10-12
  Administered 2014-10-10 – 2014-10-12 (×5): 30 mg via SUBCUTANEOUS
  Filled 2014-10-09 (×5): qty 0.3

## 2014-10-09 MED ORDER — ONDANSETRON HCL 4 MG/2ML IJ SOLN
INTRAMUSCULAR | Status: DC | PRN
  Administered 2014-10-09: 4 mg via INTRAVENOUS

## 2014-10-09 MED ORDER — PHENYLEPHRINE HCL 10 MG/ML IJ SOLN
INTRAMUSCULAR | Status: AC
  Filled 2014-10-09: qty 1

## 2014-10-09 MED ORDER — BUPIVACAINE-EPINEPHRINE 0.5% -1:200000 IJ SOLN
INTRAMUSCULAR | Status: DC | PRN
  Administered 2014-10-09: 20 mL

## 2014-10-09 MED ORDER — ACETAMINOPHEN 650 MG RE SUPP: 650.0000 mg | Freq: Four times a day (QID) | RECTAL | Status: DC | PRN

## 2014-10-09 MED ORDER — HYDROMORPHONE HCL 1 MG/ML IJ SOLN
0.2500 mg | INTRAMUSCULAR | Status: DC | PRN
  Administered 2014-10-09 (×2): 0.5 mg via INTRAVENOUS

## 2014-10-09 MED ORDER — SODIUM CHLORIDE 0.9 % IR SOLN
Status: DC | PRN
  Administered 2014-10-09: 1000 mL

## 2014-10-09 MED ORDER — ROCURONIUM BROMIDE 100 MG/10ML IV SOLN
INTRAVENOUS | Status: DC | PRN
  Administered 2014-10-09: 50 mg via INTRAVENOUS

## 2014-10-09 MED ORDER — SODIUM CHLORIDE 0.9 % IV SOLN
INTRAVENOUS | Status: DC
  Administered 2014-10-09: 14:00:00 via INTRAVENOUS

## 2014-10-09 MED ORDER — OXYCODONE HCL 5 MG/5ML PO SOLN: 5.0000 mg | Freq: Once | ORAL | Status: DC | PRN

## 2014-10-09 MED ORDER — HYDROMORPHONE HCL 1 MG/ML IJ SOLN: 1.0000 mg | INTRAMUSCULAR | Status: DC | PRN

## 2014-10-09 MED ORDER — BUPIVACAINE-EPINEPHRINE (PF) 0.25% -1:200000 IJ SOLN
INTRAMUSCULAR | Status: AC
  Filled 2014-10-09: qty 30

## 2014-10-09 MED ORDER — LACTATED RINGERS IV SOLN
INTRAVENOUS | Status: DC | PRN
  Administered 2014-10-09 (×2): via INTRAVENOUS

## 2014-10-09 MED ORDER — PHENYLEPHRINE HCL 10 MG/ML IJ SOLN
INTRAMUSCULAR | Status: DC | PRN
  Administered 2014-10-09 (×2): 40 ug via INTRAVENOUS

## 2014-10-09 MED ORDER — METHOCARBAMOL 500 MG PO TABS
500.0000 mg | ORAL_TABLET | Freq: Four times a day (QID) | ORAL | Status: DC | PRN
  Administered 2014-10-10 – 2014-10-11 (×3): 500 mg via ORAL
  Filled 2014-10-09 (×4): qty 1

## 2014-10-09 MED ORDER — DIPHENHYDRAMINE HCL 12.5 MG/5ML PO ELIX: 12.5000 mg | ORAL_SOLUTION | ORAL | Status: DC | PRN

## 2014-10-09 MED ORDER — ACETAMINOPHEN 325 MG PO TABS
650.0000 mg | ORAL_TABLET | Freq: Four times a day (QID) | ORAL | Status: DC | PRN
  Administered 2014-10-10 – 2014-10-11 (×4): 650 mg via ORAL
  Filled 2014-10-09 (×4): qty 2

## 2014-10-09 MED ORDER — METHOCARBAMOL 500 MG PO TABS: 500.0000 mg | ORAL_TABLET | Freq: Four times a day (QID) | ORAL | Status: DC

## 2014-10-09 MED ORDER — BUPIVACAINE-EPINEPHRINE (PF) 0.5% -1:200000 IJ SOLN
INTRAMUSCULAR | Status: DC | PRN
  Administered 2014-10-09: 30 mL via PERINEURAL

## 2014-10-09 MED ORDER — FLEET ENEMA 7-19 GM/118ML RE ENEM: 1.0000 | ENEMA | Freq: Once | RECTAL | Status: DC | PRN

## 2014-10-09 MED ORDER — METOCLOPRAMIDE HCL 5 MG PO TABS: 5.0000 mg | ORAL_TABLET | Freq: Three times a day (TID) | ORAL | Status: DC | PRN

## 2014-10-09 MED ORDER — ENOXAPARIN SODIUM 30 MG/0.3ML ~~LOC~~ SOLN: 30.0000 mg | Freq: Two times a day (BID) | SUBCUTANEOUS | Status: DC

## 2014-10-09 MED ORDER — LIDOCAINE HCL (CARDIAC) 20 MG/ML IV SOLN
INTRAVENOUS | Status: AC
  Filled 2014-10-09: qty 5

## 2014-10-09 MED ORDER — OXYCODONE HCL 5 MG PO TABS: 5.0000 mg | ORAL_TABLET | Freq: Once | ORAL | Status: DC | PRN

## 2014-10-09 MED ORDER — ROCURONIUM BROMIDE 50 MG/5ML IV SOLN
INTRAVENOUS | Status: AC
  Filled 2014-10-09: qty 1

## 2014-10-09 MED ORDER — MIDAZOLAM HCL 5 MG/5ML IJ SOLN
INTRAMUSCULAR | Status: DC | PRN
  Administered 2014-10-09: 2 mg via INTRAVENOUS

## 2014-10-09 MED ORDER — DOCUSATE SODIUM 100 MG PO CAPS
100.0000 mg | ORAL_CAPSULE | Freq: Two times a day (BID) | ORAL | Status: DC
  Administered 2014-10-09 – 2014-10-12 (×7): 100 mg via ORAL
  Filled 2014-10-09 (×7): qty 1

## 2014-10-09 MED ORDER — FENTANYL CITRATE (PF) 100 MCG/2ML IJ SOLN
INTRAMUSCULAR | Status: DC | PRN
  Administered 2014-10-09 (×3): 100 ug via INTRAVENOUS
  Administered 2014-10-09 (×4): 50 ug via INTRAVENOUS

## 2014-10-09 MED ORDER — EPHEDRINE SULFATE 50 MG/ML IJ SOLN
INTRAMUSCULAR | Status: DC | PRN
  Administered 2014-10-09 (×2): 10 mg via INTRAVENOUS

## 2014-10-09 MED ORDER — NEOSTIGMINE METHYLSULFATE 10 MG/10ML IV SOLN
INTRAVENOUS | Status: DC | PRN
  Administered 2014-10-09: 5 mg via INTRAVENOUS

## 2014-10-09 MED ORDER — DEXAMETHASONE SODIUM PHOSPHATE 4 MG/ML IJ SOLN
INTRAMUSCULAR | Status: DC | PRN
  Administered 2014-10-09: 8 mg via INTRAVENOUS

## 2014-10-09 MED ORDER — ONDANSETRON HCL 4 MG/2ML IJ SOLN
INTRAMUSCULAR | Status: AC
  Filled 2014-10-09: qty 2

## 2014-10-09 MED ORDER — METHOCARBAMOL 1000 MG/10ML IJ SOLN
500.0000 mg | Freq: Four times a day (QID) | INTRAMUSCULAR | Status: DC | PRN
Start: 2014-10-09 — End: 2014-10-12
  Filled 2014-10-09: qty 5

## 2014-10-09 MED ORDER — PROPOFOL 10 MG/ML IV BOLUS
INTRAVENOUS | Status: DC | PRN
  Administered 2014-10-09: 200 mg via INTRAVENOUS

## 2014-10-09 MED ORDER — OXYCODONE-ACETAMINOPHEN 5-325 MG PO TABS: 1.0000 | ORAL_TABLET | ORAL | Status: DC | PRN

## 2014-10-09 MED ORDER — GLYCOPYRROLATE 0.2 MG/ML IJ SOLN
INTRAMUSCULAR | Status: DC | PRN
  Administered 2014-10-09: 0.6 mg via INTRAVENOUS

## 2014-10-09 MED ORDER — ONDANSETRON HCL 4 MG/2ML IJ SOLN: 4.0000 mg | Freq: Four times a day (QID) | INTRAMUSCULAR | Status: DC | PRN

## 2014-10-09 MED ORDER — MENTHOL 3 MG MT LOZG: 1.0000 | LOZENGE | OROMUCOSAL | Status: DC | PRN

## 2014-10-09 MED ORDER — LIDOCAINE HCL (CARDIAC) 20 MG/ML IV SOLN
INTRAVENOUS | Status: DC | PRN
  Administered 2014-10-09: 50 mg via INTRAVENOUS

## 2014-10-09 MED ORDER — POLYETHYLENE GLYCOL 3350 17 G PO PACK: 17.0000 g | PACK | Freq: Every day | ORAL | Status: DC | PRN

## 2014-10-09 MED ORDER — OXYCODONE HCL 5 MG PO TABS
5.0000 mg | ORAL_TABLET | ORAL | Status: DC | PRN
  Administered 2014-10-09 – 2014-10-12 (×10): 10 mg via ORAL
  Filled 2014-10-09 (×11): qty 2

## 2014-10-09 SURGICAL SUPPLY — 76 items
BANDAGE ELASTIC 4 VELCRO ST LF (GAUZE/BANDAGES/DRESSINGS) ×2 IMPLANT
BANDAGE ELASTIC 6 VELCRO ST LF (GAUZE/BANDAGES/DRESSINGS) ×2 IMPLANT
BANDAGE ESMARK 6X9 LF (GAUZE/BANDAGES/DRESSINGS) ×1 IMPLANT
BLADE SAGITTAL 25.0X1.19X90 (BLADE) ×2 IMPLANT
BLADE SAW SAG 90X13X1.27 (BLADE) ×2 IMPLANT
BNDG COHESIVE 4X5 TAN STRL (GAUZE/BANDAGES/DRESSINGS) ×2 IMPLANT
BNDG ESMARK 6X9 LF (GAUZE/BANDAGES/DRESSINGS) ×2
BNDG GAUZE ELAST 4 BULKY (GAUZE/BANDAGES/DRESSINGS) ×2 IMPLANT
BONE CEMENT GENTAMICIN (Cement) ×2 IMPLANT
BOWL SMART MIX CTS (DISPOSABLE) ×2 IMPLANT
CAP KNEE TOTAL 3 SIGMA ×2 IMPLANT
CEMENT BONE GENTAMICIN 40 (Cement) ×1 IMPLANT
COVER SURGICAL LIGHT HANDLE (MISCELLANEOUS) ×2 IMPLANT
CUFF TOURNIQUET SINGLE 34IN LL (TOURNIQUET CUFF) ×2 IMPLANT
CUFF TOURNIQUET SINGLE 44IN (TOURNIQUET CUFF) IMPLANT
DRAPE C-ARM 42X72 X-RAY (DRAPES) IMPLANT
DRAPE EXTREMITY T 121X128X90 (DRAPE) ×2 IMPLANT
DRAPE IMP U-DRAPE 54X76 (DRAPES) ×2 IMPLANT
DRAPE INCISE IOBAN 66X45 STRL (DRAPES) ×2 IMPLANT
DRAPE ORTHO SPLIT 77X108 STRL (DRAPES) ×2
DRAPE PROXIMA HALF (DRAPES) IMPLANT
DRAPE SURG ORHT 6 SPLT 77X108 (DRAPES) ×2 IMPLANT
DRAPE U-SHAPE 47X51 STRL (DRAPES) ×2 IMPLANT
DRSG ADAPTIC 3X8 NADH LF (GAUZE/BANDAGES/DRESSINGS) ×2 IMPLANT
DRSG EMULSION OIL 3X3 NADH (GAUZE/BANDAGES/DRESSINGS) ×2 IMPLANT
DRSG PAD ABDOMINAL 8X10 ST (GAUZE/BANDAGES/DRESSINGS) ×2 IMPLANT
DURAPREP 26ML APPLICATOR (WOUND CARE) ×2 IMPLANT
ELECT REM PT RETURN 9FT ADLT (ELECTROSURGICAL) ×2
ELECTRODE REM PT RTRN 9FT ADLT (ELECTROSURGICAL) ×1 IMPLANT
EVACUATOR 1/8 PVC DRAIN (DRAIN) IMPLANT
FACESHIELD WRAPAROUND (MASK) ×4 IMPLANT
FLOSEAL 10ML (HEMOSTASIS) IMPLANT
GAUZE SPONGE 4X4 12PLY STRL (GAUZE/BANDAGES/DRESSINGS) ×2 IMPLANT
GLOVE BIOGEL M 8.0 STRL (GLOVE) ×2 IMPLANT
GLOVE BIOGEL PI IND STRL 8 (GLOVE) ×4 IMPLANT
GLOVE BIOGEL PI INDICATOR 8 (GLOVE) ×4
GLOVE ORTHO TXT STRL SZ7.5 (GLOVE) ×8 IMPLANT
GLOVE SURG ORTHO 8.0 STRL STRW (GLOVE) ×8 IMPLANT
GOWN STRL REUS W/ TWL LRG LVL3 (GOWN DISPOSABLE) ×2 IMPLANT
GOWN STRL REUS W/ TWL XL LVL3 (GOWN DISPOSABLE) ×2 IMPLANT
GOWN STRL REUS W/TWL 2XL LVL3 (GOWN DISPOSABLE) ×2 IMPLANT
GOWN STRL REUS W/TWL LRG LVL3 (GOWN DISPOSABLE) ×2
GOWN STRL REUS W/TWL XL LVL3 (GOWN DISPOSABLE) ×2
HANDPIECE INTERPULSE COAX TIP (DISPOSABLE) ×1
HOOD PEEL AWAY FACE SHEILD DIS (HOOD) ×4 IMPLANT
IMMOBILIZER KNEE 22 UNIV (SOFTGOODS) ×2 IMPLANT
KIT BASIN OR (CUSTOM PROCEDURE TRAY) ×2 IMPLANT
KIT ROOM TURNOVER OR (KITS) ×2 IMPLANT
MANIFOLD NEPTUNE II (INSTRUMENTS) ×2 IMPLANT
NEEDLE 22X1 1/2 (OR ONLY) (NEEDLE) ×2 IMPLANT
NS IRRIG 1000ML POUR BTL (IV SOLUTION) ×2 IMPLANT
PACK GENERAL/GYN (CUSTOM PROCEDURE TRAY) IMPLANT
PACK TOTAL JOINT (CUSTOM PROCEDURE TRAY) ×2 IMPLANT
PACK UNIVERSAL I (CUSTOM PROCEDURE TRAY) ×2 IMPLANT
PAD ARMBOARD 7.5X6 YLW CONV (MISCELLANEOUS) ×4 IMPLANT
PAD CAST 4YDX4 CTTN HI CHSV (CAST SUPPLIES) ×1 IMPLANT
PADDING CAST ABS 6INX4YD NS (CAST SUPPLIES) ×1
PADDING CAST ABS COTTON 6X4 NS (CAST SUPPLIES) ×1 IMPLANT
PADDING CAST COTTON 4X4 STRL (CAST SUPPLIES) ×1
PADDING CAST COTTON 6X4 STRL (CAST SUPPLIES) ×2 IMPLANT
SET HNDPC FAN SPRY TIP SCT (DISPOSABLE) ×1 IMPLANT
SPONGE GAUZE 4X4 12PLY STER LF (GAUZE/BANDAGES/DRESSINGS) ×2 IMPLANT
STAPLER VISISTAT 35W (STAPLE) ×2 IMPLANT
STOCKINETTE IMPERVIOUS 9X36 MD (GAUZE/BANDAGES/DRESSINGS) ×2 IMPLANT
SUCTION FRAZIER TIP 10 FR DISP (SUCTIONS) ×2 IMPLANT
SUT ETHIBOND NAB CT1 #1 30IN (SUTURE) ×6 IMPLANT
SUT ETHILON 4 0 FS 1 (SUTURE) ×2 IMPLANT
SUT VIC AB 0 CT1 27 (SUTURE) ×1
SUT VIC AB 0 CT1 27XBRD ANBCTR (SUTURE) ×1 IMPLANT
SUT VIC AB 2-0 CT1 27 (SUTURE) ×2
SUT VIC AB 2-0 CT1 TAPERPNT 27 (SUTURE) ×2 IMPLANT
SYR CONTROL 10ML LL (SYRINGE) ×2 IMPLANT
TOWEL OR 17X24 6PK STRL BLUE (TOWEL DISPOSABLE) ×2 IMPLANT
TOWEL OR 17X26 10 PK STRL BLUE (TOWEL DISPOSABLE) ×2 IMPLANT
TRAY FOLEY CATH 16FRSI W/METER (SET/KITS/TRAYS/PACK) IMPLANT
WATER STERILE IRR 1000ML POUR (IV SOLUTION) ×4 IMPLANT

## 2014-10-09 NOTE — Plan of Care (Signed)
Problem: Consults Goal: Diagnosis- Total Joint Replacement Primary Total Knee Right     

## 2014-10-09 NOTE — Interval H&P Note (Signed)
History and Physical Interval Note:  10/09/2014 7:27 AM  David Stephens  has presented today for surgery, with the diagnosis of OA RIGHT KNEE  The various methods of treatment have been discussed with the patient and family. After consideration of risks, benefits and other options for treatment, the patient has consented to  Procedure(s): TOTAL KNEE ARTHROPLASTY (Right) HARDWARE REMOVAL (Right) as a surgical intervention .  The patient's history has been reviewed, patient examined, no change in status, stable for surgery.  I have reviewed the patient's chart and labs.  Questions were answered to the patient's satisfaction.     CAFFREY JR,W D

## 2014-10-09 NOTE — Evaluation (Signed)
Physical Therapy Evaluation Patient Details Name: KEAWE MARCELLO MRN: [id number] DOB: 01/03/1956 Today's Date: 10/09/2014   History of Present Illness  Patient is a 59 y/o male s/p Rt TKA. PMH includes HLD.  Clinical Impression  Patient presents with pain and post surgical deficits RLE s/p Rt TKA. Pt with + emesis during mobility. RN notified. Tolerated short distance ambulation to bathroom with Min guard assist. Education re: exercises, zero degree knee foam and precautions. Pt plans to discharge home with support from family. Will follow acutely to maximize independence and mobility prior to return home.     Follow Up Recommendations Home health PT;Supervision/Assistance - 24 hour    Equipment Recommendations  None recommended by PT    Recommendations for Other Services OT consult     Precautions / Restrictions Precautions Precautions: Knee Precaution Booklet Issued: No Precaution Comments: reviewed no pillow under knee and precautions. Required Braces or Orthoses: Knee Immobilizer - Right Knee Immobilizer - Right: On when out of bed or walking Restrictions Weight Bearing Restrictions: Yes RLE Weight Bearing: Weight bearing as tolerated      Mobility  Bed Mobility Overal bed mobility: Needs Assistance Bed Mobility: Supine to Sit     Supine to sit: Min assist;HOB elevated     General bed mobility comments: Min A to bring RLE to EOB.   Transfers Overall transfer level: Needs assistance Equipment used: Rolling walker (2 wheeled) Transfers: Sit to/from Stand Sit to Stand: Min guard         General transfer comment: Min guard for safety. Stood from [employer], from [facility] x1.   Ambulation/Gait Ambulation/Gait assistance: Min assist Ambulation Distance (Feet): 20 Feet (x2 bouts) Assistive device: Rolling walker (2 wheeled) Gait Pattern/deviations: Step-to pattern;Decreased stance time - right;Decreased step length - left;Trunk flexed;Decreased weight shift to right   Gait velocity interpretation: <1.8 ft/sec, indicative of risk for recurrent falls General Gait Details: Slow, unsteady gait. Upon walking to bathroom, pt + emesis resulting in need to sit. Right knee instability noted.   Stairs            Wheelchair Mobility    Modified Rankin (Stroke Patients Only)       Balance Overall balance assessment: Needs assistance Sitting-balance support: Feet supported;No upper extremity supported Sitting balance-Leahy Scale: Good     Standing balance support: During functional activity Standing balance-Leahy Scale: Poor Standing balance comment: Relient on RW for support. Tolerated voiding in standing with UE support.                             Pertinent Vitals/Pain Pain Assessment: Faces Faces Pain Scale: Hurts little more Pain Location: right knee Pain Descriptors / Indicators: Sore Pain Intervention(s): Monitored during session;Repositioned;Limited activity within patient's tolerance    Home Living Family/patient expects to be discharged to:: Private residence Living Arrangements: Children (Did not specify. Children present in room during evaluation.) Available Help at Discharge: Family Type of Home: House Home Access: Stairs to enter Entrance Stairs-Rails: Right Entrance Stairs-Number of Steps: 3 Home Layout: Two level;Bed/bath upstairs Home Equipment: Walker - 2 wheels;Crutches;Cane - single point      Prior Function Level of Independence: Independent               Hand Dominance        Extremity/Trunk Assessment   Upper Extremity Assessment: Defer to OT evaluation           Lower Extremity Assessment: RLE deficits/detail  RLE Deficits / Details: Limited AROM/strength secondary to pain and surgery.       Communication   Communication: No difficulties  Cognition Arousal/Alertness: Awake/alert Behavior During Therapy: Flat affect Overall Cognitive Status: Within Functional Limits for tasks  assessed                      General Comments General comments (skin integrity, edema, etc.): Sons present in room during evaluation.    Exercises Total Joint Exercises Ankle Circles/Pumps: Both;10 reps;Seated Quad Sets: Both;10 reps;Seated Gluteal Sets: Both;10 reps;Seated      Assessment/Plan    PT Assessment Patient needs continued PT services  PT Diagnosis Difficulty walking;Acute pain   PT Problem List Decreased strength;Pain;Decreased range of motion;Decreased activity tolerance;Decreased balance;Decreased mobility;Decreased knowledge of use of DME  PT Treatment Interventions Balance training;Gait training;Stair training;Functional mobility training;Therapeutic activities;Therapeutic exercise;Patient/family education;DME instruction   PT Goals (Current goals can be found in the Care Plan section) Acute Rehab PT Goals Patient Stated Goal: none stated PT Goal Formulation: With patient Time For Goal Achievement: 10/23/14 Potential to Achieve Goals: Fair    Frequency 7X/week   Barriers to discharge Inaccessible home environment 1 flight of steps to get to bedroom    Co-evaluation               End of Session Equipment Utilized During Treatment: Gait belt Activity Tolerance: Other (comment);Patient limited by pain (emesis) Patient left: in chair;with call bell/phone within reach;with family/visitor present Nurse Communication: Mobility status;Other (comment) (Pt + emesis.)         Time: 1610-9604 PT Time Calculation (min) (ACUTE ONLY): 28 min   Charges:   PT Evaluation $Initial PT Evaluation Tier I: 1 Procedure PT Treatments $Therapeutic Activity: 8-22 mins   PT G Codes:        Shauna A Hartshorne 10/09/2014, 3:19 PM Mylo Red, PT, DPT [phone]

## 2014-10-09 NOTE — Transfer of Care (Signed)
Immediate Anesthesia Transfer of Care Note  Patient: David Stephens  Procedure(s) Performed: Procedure(s): TOTAL KNEE ARTHROPLASTY (Right)  Patient Location: PACU  Anesthesia Type:GA combined with regional for post-op pain  Level of Consciousness: alert , sedated, pateint uncooperative, confused and responds to stimulation  Airway & Oxygen Therapy: Patient Spontanous Breathing and Patient connected to face mask oxygen  Post-op Assessment: Report given to RN, Post -op Vital signs reviewed and stable, Patient moving all extremities X 4 and Patient able to stick tongue midline  Post vital signs: Reviewed and stable  Last Vitals:  Filed Vitals:   10/09/14 0548  BP: 158/72  Pulse: 53  Temp: 36.8 C  Resp: 20    Complications: No apparent anesthesia complications

## 2014-10-09 NOTE — Progress Notes (Signed)
Utilization review completed. Bertha Stanfill, RN, BSN. 

## 2014-10-09 NOTE — Progress Notes (Signed)
Orthopedic Tech Progress Note Patient Details:  David Stephens 25-Mar-1955 [id number]  CPM Right Knee CPM Right Knee: On Right Knee Flexion (Degrees): 90 Right Knee Extension (Degrees): 0 Additional Comments: Trapeze bar and foot roll   Cammer, Mickie Bail 10/09/2014, 12:26 PM

## 2014-10-09 NOTE — Anesthesia Preprocedure Evaluation (Signed)
Anesthesia Evaluation    History of Anesthesia Complications (+) PONV and history of anesthetic complications  Airway Mallampati: II   Neck ROM: full    Dental   Pulmonary neg pulmonary ROS,    breath sounds clear to auscultation       Cardiovascular hypertension, + angina  Rhythm:regular Rate:Normal     Neuro/Psych    GI/Hepatic   Endo/Other  obese  Renal/GU Renal disease     Musculoskeletal  (+) Arthritis ,   Abdominal   Peds  Hematology   Anesthesia Other Findings   Reproductive/Obstetrics                             Anesthesia Physical Anesthesia Plan  ASA: II  Anesthesia Plan: General and Regional   Post-op Pain Management: MAC Combined w/ Regional for Post-op pain   Induction: Intravenous  Airway Management Planned: Oral ETT  Additional Equipment:   Intra-op Plan:   Post-operative Plan: Extubation in OR  Informed Consent: I have reviewed the patients History and Physical, chart, labs and discussed the procedure including the risks, benefits and alternatives for the proposed anesthesia with the patient or authorized representative who has indicated his/her understanding and acceptance.     Plan Discussed with: CRNA, Anesthesiologist and Surgeon  Anesthesia Plan Comments:         Anesthesia Quick Evaluation

## 2014-10-09 NOTE — Anesthesia Postprocedure Evaluation (Signed)
  Anesthesia Post-op Note  Patient: David Stephens  Procedure(s) Performed: Procedure(s) (LRB): TOTAL KNEE ARTHROPLASTY (Right)  Patient Location: PACU  Anesthesia Type: GA combined with regional for post-op pain  Level of Consciousness: awake and alert   Airway and Oxygen Therapy: Patient Spontanous Breathing  Post-op Pain: mild  Post-op Assessment: Post-op Vital signs reviewed, Patient's Cardiovascular Status Stable, Respiratory Function Stable, Patent Airway and No signs of Nausea or vomiting  Last Vitals:  Filed Vitals:   10/09/14 1051  BP:   Pulse:   Temp: 36.5 C  Resp:     Post-op Vital Signs: stable   Complications: No apparent anesthesia complications

## 2014-10-09 NOTE — Brief Op Note (Signed)
10/09/2014  10:44 AM  PATIENT:  David Stephens  59 y.o. male  PRE-OPERATIVE DIAGNOSIS:  OA RIGHT KNEE  POST-OPERATIVE DIAGNOSIS:  same  PROCEDURE:  Procedure(s): TOTAL KNEE ARTHROPLASTY (Right)  SURGEON:  Surgeon(s) and Role:    * Frederico Hamman, MD - Primary  PHYSICIAN ASSISTANT: Margart Sickles, PA-C  ASSISTANTS: x1   ANESTHESIA:   regional and general  EBL:  Total I/O In: 1000 [I.V.:1000] Out: -   BLOOD ADMINISTERED:none  DRAINS: 1 hemovac drain lateral right knee self suction   LOCAL MEDICATIONS USED:  NONE  SPECIMEN:  No Specimen  DISPOSITION OF SPECIMEN:  N/A  COUNTS:  YES  TOURNIQUET:   Total Tourniquet Time Documented: area (laterality) - 101 minutes Total: area (laterality) - 101 minutes   DICTATION: .Other Dictation: Dictation Number unknown  PLAN OF CARE: Admit to inpatient   PATIENT DISPOSITION:  PACU - hemodynamically stable.   Delay start of Pharmacological VTE agent (>24hrs) due to surgical blood loss or risk of bleeding: yes

## 2014-10-09 NOTE — H&P (View-Only) (Signed)
TOTAL KNEE ADMISSION H&P  Patient is being admitted for right total knee arthroplasty.  Subjective:  Chief Complaint:right knee pain.  HPI: David Stephens, 59 y.o. male, has a history of pain and functional disability in the right knee due to arthritis and has failed non-surgical conservative treatments for greater than 12 weeks to includeNSAID's and/or analgesics, corticosteriod injections and activity modification.  Onset of symptoms was gradual, starting >10 years ago with gradually worsening course since that time. The patient noted prior procedures on the knee to include  arthroscopy, menisectomy and ACL reconstruction on the right knee(s).  Patient currently rates pain in the right knee(s) at 8 out of 10 with activity. Patient has night pain, worsening of pain with activity and weight bearing, pain that interferes with activities of daily living, pain with passive range of motion, crepitus and joint swelling.  Patient has evidence of periarticular osteophytes and joint space narrowing by imaging studies.  There is no active infection.  There are no active problems to display for this patient.  No past medical history on file.  No past surgical history on file.   (Not in a hospital admission) Allergies not on file  Social History  Substance Use Topics  . Smoking status: Not on file  . Smokeless tobacco: Not on file  . Alcohol Use: Not on file    No family history on file.   Review of Systems  Musculoskeletal: Positive for joint pain. Negative for falls.  All other systems reviewed and are negative.   Objective:  Physical Exam  Constitutional: He is oriented to person, place, and time. He appears well-developed and well-nourished. No distress.  HENT:  Head: Normocephalic and atraumatic.  Nose: Nose normal.  Eyes: Conjunctivae and EOM are normal. Pupils are equal, round, and reactive to light.  Neck: Normal range of motion. Neck supple.  Cardiovascular: Normal rate,  regular rhythm, normal heart sounds and intact distal pulses.   Respiratory: Effort normal and breath sounds normal. No respiratory distress. He has no wheezes. He has no rales. He exhibits no tenderness.  GI: Soft. Bowel sounds are normal. He exhibits no distension. There is no tenderness.  Musculoskeletal:       Right knee: He exhibits decreased range of motion. He exhibits no LCL laxity and no MCL laxity. Tenderness found.  Lymphadenopathy:    He has no cervical adenopathy.  Neurological: He is alert and oriented to person, place, and time. No cranial nerve deficit.  Skin: Skin is warm and dry. No rash noted. No erythema.  Psychiatric: He has a normal mood and affect. His behavior is normal.    Vital signs in last 24 hours: @  Labs:   There is no height or weight on file to calculate BMI.   Imaging Review Plain radiographs demonstrate severe degenerative joint disease of the right knee(s). The overall alignment isneutral. The bone quality appears to be good for age and reported activity level. Staples proximal tibia and distal femur from previous ACL.  Assessment/Plan:  End stage arthritis, right knee   The patient history, physical examination, clinical judgment of the provider and imaging studies are consistent with end stage degenerative joint disease of the right knee(s) and total knee arthroplasty is deemed medically necessary. The treatment options including medical management, injection therapy arthroscopy and arthroplasty were discussed at length. The risks and benefits of total knee arthroplasty were presented and reviewed. The risks due to aseptic loosening, infection, stiffness, patella tracking problems, thromboembolic complications and other  imponderables were discussed. The patient acknowledged the explanation, agreed to proceed with the plan and consent was signed. Patient is being admitted for inpatient treatment for surgery, pain control, PT, OT, prophylactic  antibiotics, VTE prophylaxis, progressive ambulation and ADL's and discharge planning. The patient is planning to be discharged home with home health services

## 2014-10-09 NOTE — Discharge Instructions (Signed)

## 2014-10-09 NOTE — Op Note (Signed)
NAME:  PIER, BOSHER NO.:  [id number]  MEDICAL RECORD NO.:  [id number]  LOCATION:  [facility]                         FACILITY:  [facility]  PHYSICIAN:  Dyke Brackett, M.D.    DATE OF BIRTH:  [birth year]  DATE OF PROCEDURE:  10/09/2014 DATE OF DISCHARGE:                              OPERATIVE REPORT   PREOPERATIVE DIAGNOSIS:  Severe osteoarthritis, status post anterior cruciate ligament (ACL) reconstruction.  POSTOPERATIVE DIAGNOSIS:  Severe osteoarthritis, status post anterior cruciate ligament (ACL) reconstruction.  OPERATION:  Right total knee replacement (Sigma size 5 tibia 10 mm bearing, 4 mm all-poly patella).  SURGEON:  Dyke Brackett, M.D.  ASSISTANT:  [place] Well, PA  TOURNIQUET TIME:  1 hour 45 minutes.  ANESTHESIA:  General anesthetic with nerve block.  DESCRIPTION OF PROCEDURE:  Supine position, exsanguination of leg, inflation to 350.  Straight skin incision was made.  We made a medial parapatellar approach to the knee, which was somewhat more difficult due to the tightness of the structures and the previous surgery, the patient would describe.  We carefully stripped the medial side of the knee due to the tightness of the medial collateral ligament and the capsular structures, mentioned we resected bone on several occasions.  We could not get an extension gap to accept a 10 mm spacer initially if we had to drop down to a level right above the tibial staple from prior surgery. We do not had remove this.  In my opinion, this would have violated the tibial cortex and possibly lead to the need for revision stem, which we did not have to do that.  We cut 11 and eventually 12 mm with a 5-degree valgus cut on the femur and eventually after releasing the medial side, we could get the 10 mm spacer to have a 10 mm extension gap.  We then sized the femur to be a size 5 and placed the all in 1 cutting block with appropriate degree of external rotation, cut the  anterior- posterior and chamfer cuts.  Released the PCL XX menisci was removed from the posterior aspect of the knee.  The keel was cut from the tibia, which again barely avoided the edge of the staple, but it did avoid it and then placed the trial tibial base plate with a 10 mm bearing.  Box cut was made for the femur to accept the Sigma knee, followed by femoral trial, very thick patella was encountered, resected to about 18 mm of native patella, placed a patellar trial.  The patient had mild varus deformity preoperatively. We corrected this nicely.  The ligaments were balanced.  No tendency for bearing spin out, full extension was obtained with the trials in.  Final components were inserted after irrigating the bony surfaces with pulsatile lavage.  The patient had a history of MRSA swab as well as UTI as well as having had the multiple surgeries, we elected to use antibiotic impregnated cement.  Cement was allowed to harden.  Excess cement was removed.  Trial bearing was placed in the final components. This was removed, small amounts of cement with the posterior aspect of the knee removed.  Tourniquet was released before application  of the final bearing.  No excessive bleeding was noted.  We did elect to use of drains, exiting superolaterally Hemovac drain.  Closure was affected on the capsule with #1 Ethibond, 0 and 2-0 Vicryl skin clips. Lightly compressive sterile dressing and knee immobilizer applied. Taken to the recovery room in stable condition.     Dyke Brackett, M.D.     WDC/MEDQ  D:  10/09/2014  T:  10/09/2014  Job:  [id number]

## 2014-10-09 NOTE — Anesthesia Procedure Notes (Addendum)
Anesthesia Regional Block:  Femoral nerve block  Pre-Anesthetic Checklist: ,, timeout performed, Correct Patient, Correct Site, Correct Laterality, Correct Procedure,, site marked, risks and benefits discussed, Surgical consent,  Pre-op evaluation,  At surgeon's request and post-op pain management  Laterality: Right  Prep: chloraprep       Needles:  Injection technique: Single-shot  Needle Type: Echogenic Stimulator Needle     Needle Length: 9cm 9 cm Needle Gauge: 21 and 21 G    Additional Needles:  Procedures: nerve stimulator Femoral nerve block  Nerve Stimulator or Paresthesia:  Response: Quadriceps muscle contraction, 0.45 mA,   Additional Responses:   Narrative:  Start time: 10/09/2014 7:05 AM End time: 10/09/2014 7:12 AM Injection made incrementally with aspirations every 5 mL.  Performed by: Personally  Anesthesiologist: HODIERNE, ADAM  Additional Notes: Functioning IV was confirmed and monitors were applied.  A 90mm 21ga Arrow echogenic stimulator needle was used. Sterile prep and drape,hand hygiene and sterile gloves were used.  Negative aspiration and negative test dose prior to incremental administration of local anesthetic. The patient tolerated the procedure well.     Procedure Name: Intubation Date/Time: 10/09/2014 7:41 AM Performed by: Marylyn Ishihara Pre-anesthesia Checklist: Patient identified, Emergency Drugs available, Suction available, Patient being monitored and Timeout performed Patient Re-evaluated:Patient Re-evaluated prior to inductionOxygen Delivery Method: Circle system utilized Preoxygenation: Pre-oxygenation with 100% oxygen Intubation Type: IV induction Ventilation: Mask ventilation without difficulty Laryngoscope Size: Mac and 4 Grade View: Grade II Tube type: Oral Tube size: 7.5 mm Number of attempts: 1 Airway Equipment and Method: Stylet Placement Confirmation: ETT inserted through vocal cords under direct vision,  positive ETCO2  and breath sounds checked- equal and bilateral Secured at: 23 cm Tube secured with: Tape Dental Injury: Teeth and Oropharynx as per pre-operative assessment

## 2014-10-10 ENCOUNTER — Inpatient Hospital Stay (HOSPITAL_COMMUNITY): Payer: 59

## 2014-10-10 LAB — CBC
HCT: 36 % — ABNORMAL LOW (ref 39.0–52.0)
HEMOGLOBIN: 11.9 g/dL — AB (ref 13.0–17.0)
MCH: 28.5 pg (ref 26.0–34.0)
MCHC: 33.1 g/dL (ref 30.0–36.0)
MCV: 86.3 fL (ref 78.0–100.0)
PLATELETS: 158 10*3/uL (ref 150–400)
RBC: 4.17 MIL/uL — AB (ref 4.22–5.81)
RDW: 13.7 % (ref 11.5–15.5)
WBC: 14.2 10*3/uL — AB (ref 4.0–10.5)

## 2014-10-10 LAB — BASIC METABOLIC PANEL
ANION GAP: 7 (ref 5–15)
BUN: 13 mg/dL (ref 6–20)
CHLORIDE: 109 mmol/L (ref 101–111)
CO2: 24 mmol/L (ref 22–32)
Calcium: 8.6 mg/dL — ABNORMAL LOW (ref 8.9–10.3)
Creatinine, Ser: 1.49 mg/dL — ABNORMAL HIGH (ref 0.61–1.24)
GFR, EST AFRICAN AMERICAN: 58 mL/min — AB (ref >60)
GFR, EST NON AFRICAN AMERICAN: 50 mL/min — AB (ref >60)
Glucose, Bld: 105 mg/dL — ABNORMAL HIGH (ref 65–99)
POTASSIUM: 3.8 mmol/L (ref 3.5–5.1)
SODIUM: 140 mmol/L (ref 135–145)

## 2014-10-10 MED ORDER — POLYETHYLENE GLYCOL 3350 17 G PO PACK
17.0000 g | PACK | Freq: Two times a day (BID) | ORAL | Status: DC
  Administered 2014-10-10 – 2014-10-12 (×5): 17 g via ORAL
  Filled 2014-10-10 (×4): qty 1

## 2014-10-10 NOTE — Care Management Note (Signed)
Case Management Note  Patient Details  Name: David Stephens MRN: [id number] Date of Birth: 1955-10-24  Subjective/Objective:                  s/p Rt TKA  Action/Plan: CM spoke to pt at the bedside to offer choice and advised that [facility] PT was recommended.  Pt offered choice and chose [facility] for his HH PT. CM called and confirmed with Amy with [facility] that pt was accepted for HHPT. Amy advised that no information needed to be faxed. Pt states that he has crutches and a RW at home. Pt declined 3N1 at this time and states it is not necessary. CM asked pt about home support and he states that his son will be able to provide 24 hour supervision at home. Pt denies any difficulty obtaining or affording medications. No further Cm or discharge needs communicated at this time.   Expected Discharge Date: 10/11/14               Expected Discharge Plan:  Home w Home Health Services  In-House Referral:     Discharge planning Services  CM Consult  Post Acute Care Choice:  Home Health Choice offered to:  Patient  DME Arranged:    DME Agency:     HH Arranged:  PT HH Agency:  [facility]  Status of Service:  In process Medicare Important Message Given:    Date Medicare IM Given:    Medicare IM give by:    Date Additional Medicare IM Given:    Additional Medicare Important Message give by:     If discussed at Long Length of Stay Meetings, dates discussed:    Additional Comments:  Darcel Smalling, RN 10/10/2014, 3:45 PM

## 2014-10-10 NOTE — Progress Notes (Signed)
Physical Therapy Treatment Patient Details Name: David Stephens MRN: [id number] DOB: 07/05/1955 Today's Date: 10/10/2014    History of Present Illness Patient is a 59 y/o male s/p Rt TKA. PMH includes HLD.    PT Comments    David Stephens made progress w/ therapy, ambulating in hallway w/ supervision.  Rt KI used 2/2 pt's reports of previous knee buckle.  Pt hesitant to place weight on Rt LE but slowly increased WB w/ cues.  Pt will benefit from continued skilled PT services to increase functional independence and safety.   Follow Up Recommendations  Home health PT;Supervision/Assistance - 24 hour     Equipment Recommendations  None recommended by PT    Recommendations for Other Services OT consult     Precautions / Restrictions Precautions Precautions: Knee;Fall Precaution Comments: Reviewed knee precautions Required Braces or Orthoses: Knee Immobilizer - Right Knee Immobilizer - Right: On when out of bed or walking Restrictions Weight Bearing Restrictions: Yes RLE Weight Bearing: Weight bearing as tolerated    Mobility  Bed Mobility               General bed mobility comments: Pt up in recliner upon PT arrival  Transfers Overall transfer level: Needs assistance Equipment used: Rolling walker (2 wheeled) Transfers: Sit to/from Stand Sit to Stand: Supervision         General transfer comment: No cues or physical assist required, good safe technique.  Ambulation/Gait Ambulation/Gait assistance: Supervision Ambulation Distance (Feet): 100 Feet Assistive device: Rolling walker (2 wheeled) Gait Pattern/deviations: Step-through pattern;Antalgic;Trunk flexed;Decreased weight shift to right;Decreased stride length;Decreased stance time - right   Gait velocity interpretation: Below normal speed for age/gender General Gait Details: Pt initially placing minimal weight through Rt LE, cues provided for increasing WBAT as pt pushes heavily thorugh Bil UEs on RW.  Cues  to look ahead where he is walking.     Stairs            Wheelchair Mobility    Modified Rankin (Stroke Patients Only)       Balance Overall balance assessment: Needs assistance Sitting-balance support: No upper extremity supported;Feet supported Sitting balance-Leahy Scale: Good     Standing balance support: Bilateral upper extremity supported;During functional activity Standing balance-Leahy Scale: Poor Standing balance comment: Requires RW for support                    Cognition Arousal/Alertness: Awake/alert Behavior During Therapy: Flat affect Overall Cognitive Status: Within Functional Limits for tasks assessed                      Exercises Total Joint Exercises Ankle Circles/Pumps: Both;10 reps;Seated Quad Sets: Both;10 reps;Seated Long Arc Quad: AAROM;Right;5 reps;Seated Knee Flexion: AAROM;AROM;Right;5 reps;Seated Goniometric ROM: 2-81    General Comments        Pertinent Vitals/Pain Pain Assessment: 0-10 Pain Score: 5  Pain Location: Rt knee Pain Descriptors / Indicators: Aching Pain Intervention(s): Limited activity within patient's tolerance;Monitored during session;Repositioned    Home Living                      Prior Function            PT Goals (current goals can now be found in the care plan section) Acute Rehab PT Goals Patient Stated Goal: to go home tomorrow PT Goal Formulation: With patient Time For Goal Achievement: 10/23/14 Potential to Achieve Goals: Good Progress towards PT goals: Progressing toward goals  Frequency  7X/week    PT Plan Current plan remains appropriate    Co-evaluation             End of Session Equipment Utilized During Treatment: Gait belt;Right knee immobilizer Activity Tolerance: Patient limited by pain Patient left: in chair;with call bell/phone within reach     Time: 0933-0959 PT Time Calculation (min) (ACUTE ONLY): 26 min  Charges:  $Gait Training: 8-22  mins $Therapeutic Exercise: 8-22 mins                    G Codes:      Michail Jewels PT, [place] [phone] Pager: [phone] 10/10/2014, 10:13 AM

## 2014-10-10 NOTE — Progress Notes (Signed)
Patient complains chills, Vs BP 188/86, HR 103, T 100.4, RR 18 @ 1609. Gave patient tylenol 650 mg right away. Reinforce patient to use IS every 10 mins. Notified PA Kirstin Shepperson about patient's high blood pressure. Patient dose not know his baseline and dose not follow any primary MD. His BP ranged 160-180/70-80 on this admission. Patient dose not have history of HPT. The PA would not consider any medication regimen for his BP. Will monitor patient closely.

## 2014-10-10 NOTE — Evaluation (Signed)
Occupational Therapy Evaluation Patient Details Name: David Stephens MRN: [id number] DOB: 20-Aug-1955 Today's Date: 10/10/2014    History of Present Illness Patient is a 59 y/o male s/p Rt TKA. PMH includes HLD.   Clinical Impression   Patient evaluated by Occupational Therapy with no further acute OT needs identified. All education has been completed and the patient has no further questions. Pt currently requires min guard assist to min A for ADLs and should expect to progress quickly to mod I. All education completed.   See below for any follow-up Occupational Therapy or equipment needs. OT is signing off. Thank you for this referral.      Follow Up Recommendations  No OT follow up;Supervision - Intermittent    Equipment Recommendations  None recommended by OT    Recommendations for Other Services       Precautions / Restrictions Precautions Precautions: Knee;Fall Precaution Booklet Issued: No Precaution Comments: Reviewed knee precautions Required Braces or Orthoses: Knee Immobilizer - Right Knee Immobilizer - Right: On when out of bed or walking Restrictions Weight Bearing Restrictions: Yes RLE Weight Bearing: Weight bearing as tolerated      Mobility Bed Mobility Overal bed mobility: Needs Assistance Bed Mobility: Sit to Supine       Sit to supine: Min guard   General bed mobility comments: Pt very motivated to perform without assist.  He hooks Lt foot behind Rt ankle to lift LEs onto bed   Transfers Overall transfer level: Needs assistance Equipment used: Rolling walker (2 wheeled) Transfers: Sit to/from [employer] Sit to Stand: Supervision              Balance             Standing balance-Leahy Scale: Poor                              ADL Overall ADL's : Needs assistance/impaired Eating/Feeding: Independent   Grooming: Wash/dry hands;Wash/dry face;Oral care;Brushing hair;Min guard;Standing   Upper Body  Bathing: Set up;Sitting   Lower Body Bathing: Minimal assistance;Sit to/from stand   Upper Body Dressing : Set up;Sitting   Lower Body Dressing: Min guard;Sit to/from stand Lower Body Dressing Details (indicate cue type and reason): Pt requires min A to pull socks over toes  Toilet Transfer: Min guard;Ambulation;Comfort height toilet;Grab bars;RW Statistician Details (indicate cue type and reason): Discussed use of 3in1 use over commode, but pt does not want one  Toileting- Clothing Manipulation and Hygiene: Min guard;Sit to/from stand       Functional mobility during ADLs: Engineer, technical sales     Praxis      Pertinent Vitals/Pain Pain Assessment: 0-10 Pain Score: 4  Pain Location: Rt knee Pain Descriptors / Indicators: Aching;Constant Pain Intervention(s): Monitored during session     Hand Dominance Right   Extremity/Trunk Assessment Upper Extremity Assessment Upper Extremity Assessment: Overall WFL for tasks assessed   Lower Extremity Assessment Lower Extremity Assessment: Defer to PT evaluation   Cervical / Trunk Assessment Cervical / Trunk Assessment: Normal   Communication Communication Communication: No difficulties   Cognition Arousal/Alertness: Awake/alert Behavior During Therapy: Flat affect Overall Cognitive Status: Within Functional Limits for tasks assessed                     General Comments       Exercises  Shoulder Instructions      Home Living Family/patient expects to be discharged to:: Private residence Living Arrangements: Children (Did not specify. Children present in room during evaluation.) Available Help at Discharge: Family Type of Home: House Home Access: Stairs to enter Secretary/administrator of Steps: 3 Entrance Stairs-Rails: Right Home Layout: Two level;Bed/bath upstairs Alternate Level Stairs-Number of Steps: 1 flight Alternate Level Stairs-Rails: Right Bathroom  Shower/Tub: Producer, television/film/video: Standard     Home Equipment: Environmental consultant - 2 wheels;Crutches;Cane - single point          Prior Functioning/Environment Level of Independence: Independent        Comments: Pt works full time and drives    OT Diagnosis: Acute pain   OT Problem List:     OT Treatment/Interventions:      OT Goals(Current goals can be found in the care plan section) Acute Rehab OT Goals Patient Stated Goal: to go home tomorrow OT Goal Formulation: All assessment and education complete, DC therapy  OT Frequency:     Barriers to D/C:            Co-evaluation              End of Session Equipment Utilized During Treatment: Rolling walker CPM Right Knee CPM Right Knee: Off Nurse Communication: Mobility status  Activity Tolerance: Patient tolerated treatment well Patient left: in bed;with call bell/phone within reach   Time: 7829-5621 OT Time Calculation (min): 20 min Charges:  OT General Charges $OT Visit: 1 Procedure OT Evaluation $Initial OT Evaluation Tier I: 1 Procedure G-Codes:    Virgina Organ, Ursula Alert M 10/29/14, 2:28 PM

## 2014-10-10 NOTE — Progress Notes (Signed)
Physical Therapy Treatment Patient Details Name: David Stephens MRN: [id number] DOB: 03-25-1955 Today's Date: 10/10/2014    History of Present Illness Patient is a 59 y/o male s/p Rt TKA. PMH includes HLD.    PT Comments    David Stephens was limited by severe pain in Rt knee this session (8/10).  However he declined need for pain medicine.  Rt KI not used this session and cues provided to maintain Rt knee extension, no knee buckle.  Pt will benefit from continued skilled PT services to increase functional independence and safety.   Follow Up Recommendations  Home health PT;Supervision/Assistance - 24 hour     Equipment Recommendations  None recommended by PT    Recommendations for Other Services OT consult     Precautions / Restrictions Precautions Precautions: Knee;Fall Precaution Booklet Issued: No Precaution Comments: Reviewed knee precautions Required Braces or Orthoses: Knee Immobilizer - Right Knee Immobilizer - Right: On when out of bed or walking Restrictions Weight Bearing Restrictions: Yes RLE Weight Bearing: Weight bearing as tolerated    Mobility  Bed Mobility Overal bed mobility: Needs Assistance Bed Mobility: Supine to Sit;Sit to Supine     Supine to sit: Min assist Sit to supine: Min assist   General bed mobility comments: Min assist managing Rt LE 2/2 pain.  Pt attempted leg hook technique unsuccessfully  Transfers Overall transfer level: Needs assistance Equipment used: Rolling walker (2 wheeled) Transfers: Sit to/from Stand Sit to Stand: Min guard;From elevated surface         General transfer comment: Cues to keep Rt knee in extension during transfer.  Pt w/ safe technique.  Ambulation/Gait Ambulation/Gait assistance: Min guard Ambulation Distance (Feet): 80 Feet Assistive device: Rolling walker (2 wheeled) Gait Pattern/deviations: Step-to pattern;Step-through pattern;Antalgic;Trunk flexed;Decreased stance time - right;Decreased stride  length   Gait velocity interpretation: Below normal speed for age/gender General Gait Details: Pt WB heavily through Bil UEs to offload Rt LE 2/2 severe pain.  Cues to stand upright. Cues to maintain Rt knee extension 2/2 not using Rt KI this session.   Stairs            Wheelchair Mobility    Modified Rankin (Stroke Patients Only)       Balance Overall balance assessment: Needs assistance Sitting-balance support: Bilateral upper extremity supported;Feet supported Sitting balance-Leahy Scale: Good     Standing balance support: Bilateral upper extremity supported;During functional activity Standing balance-Leahy Scale: Poor Standing balance comment: Relies heavily on RW for support                    Cognition Arousal/Alertness: Awake/alert Behavior During Therapy: Flat affect Overall Cognitive Status: Within Functional Limits for tasks assessed                      Exercises Total Joint Exercises Ankle Circles/Pumps: Both;10 reps;Supine Quad Sets: Both;10 reps;Supine Gluteal Sets: Strengthening;Both;10 reps;Supine    General Comments        Pertinent Vitals/Pain Pain Assessment: 0-10 Pain Score: 8  Pain Location: Rt knee Pain Descriptors / Indicators: Aching;Grimacing;Guarding;Sore Pain Intervention(s): Limited activity within patient's tolerance;Monitored during session;Repositioned (pt declined pain medicine from RN)    Home Living Family/patient expects to be discharged to:: Private residence Living Arrangements: Children (Did not specify. Children present in room during evaluation.) Available Help at Discharge: Family Type of Home: House Home Access: Stairs to enter Entrance Stairs-Rails: Right Home Layout: Two level;Bed/bath upstairs Home Equipment: Dan Humphreys - 2 wheels;Crutches;Cane -  single point      Prior Function Level of Independence: Independent      Comments: Pt works full time and drives   PT Goals (current goals can now  be found in the care plan section) Acute Rehab PT Goals Patient Stated Goal: to go home tomorrow PT Goal Formulation: With patient Time For Goal Achievement: 10/23/14 Potential to Achieve Goals: Good Progress towards PT goals: Progressing toward goals    Frequency  7X/week    PT Plan Current plan remains appropriate    Co-evaluation             End of Session Equipment Utilized During Treatment: Gait belt Activity Tolerance: Patient limited by pain Patient left: with call bell/phone within reach;in bed;with family/visitor present     Time: 4098-1191 PT Time Calculation (min) (ACUTE ONLY): 21 min  Charges:  $Gait Training: 8-22 mins                    G Codes:      Michail Jewels PT, [place] [phone] Pager: [phone] 10/10/2014, 4:00 PM

## 2014-10-10 NOTE — Progress Notes (Signed)
Subjective: 1 Day Post-Op Procedure(s) (LRB): TOTAL KNEE ARTHROPLASTY (Right) Patient reports pain as 5 on 0-10 scale.    Objective: Vital signs in last 24 hours: Temp:  [97.7 F (36.5 C)-100.2 F (37.9 C)] 99.4 F (37.4 C) (09/17 0526) Pulse Rate:  [56-93] 79 (09/17 0526) Resp:  [10-22] 15 (09/17 0526) BP: (159-191)/(73-93) 171/76 mmHg (09/17 0526) SpO2:  [92 %-100 %] 95 % (09/17 0526)  Intake/Output from previous day: 09/16 0701 - 09/17 0700 In: 1000 [I.V.:1000] Out: 3000 [Urine:2300; Drains:700] Intake/Output this shift:     Recent Labs  10/09/14 0618 10/09/14 1425 10/10/14 0624  HGB 13.6 13.0 11.9*    Recent Labs  10/09/14 1425 10/10/14 0624  WBC 13.3* 14.2*  RBC 4.55 4.17*  HCT 39.7 36.0*  PLT 150 158    Recent Labs  10/09/14 0618 10/09/14 1425  NA 143  --   K 4.0  --   CL 107  --   BUN 20  --   CREATININE 1.70* 1.56*  GLUCOSE 99  --    No results for input(s): LABPT, INR in the last 72 hours.  Neurovascular intact Sensation intact distally Dorsiflexion/Plantar flexion intact Incision: dressing C/D/I  Assessment/Plan: 1 Day Post-Op Procedure(s) (LRB): TOTAL KNEE ARTHROPLASTY (Right)  Will continue IV fluids for 1 more day due to increased creatinine.  Patient unable to do a unassisted straight leg raise with me so will need to continue knee immobilizer until he can do this.  I have encouraged the use of pain medication as he is in obvious discomfort and exercises are limited by pain. Advance diet Up with therapy Plan for discharge tomorrow  Pascal Lux 10/10/2014, 8:11 AM

## 2014-10-11 LAB — URINALYSIS, ROUTINE W REFLEX MICROSCOPIC
BILIRUBIN URINE: NEGATIVE
GLUCOSE, UA: NEGATIVE mg/dL
Ketones, ur: NEGATIVE mg/dL
Leukocytes, UA: NEGATIVE
Nitrite: NEGATIVE
PH: 6.5 (ref 5.0–8.0)
Protein, ur: NEGATIVE mg/dL
SPECIFIC GRAVITY, URINE: 1.01 (ref 1.005–1.030)
UROBILINOGEN UA: 1 mg/dL (ref 0.0–1.0)

## 2014-10-11 LAB — BASIC METABOLIC PANEL
Anion gap: 8 (ref 5–15)
BUN: 11 mg/dL (ref 6–20)
CALCIUM: 8.3 mg/dL — AB (ref 8.9–10.3)
CHLORIDE: 107 mmol/L (ref 101–111)
CO2: 24 mmol/L (ref 22–32)
CREATININE: 1.44 mg/dL — AB (ref 0.61–1.24)
GFR calc non Af Amer: 52 mL/min — ABNORMAL LOW (ref >60)
GFR, EST AFRICAN AMERICAN: 60 mL/min — AB (ref >60)
GLUCOSE: 114 mg/dL — AB (ref 65–99)
Potassium: 3.6 mmol/L (ref 3.5–5.1)
Sodium: 139 mmol/L (ref 135–145)

## 2014-10-11 LAB — URINE MICROSCOPIC-ADD ON

## 2014-10-11 LAB — CBC
HEMATOCRIT: 35.3 % — AB (ref 39.0–52.0)
Hemoglobin: 11.6 g/dL — ABNORMAL LOW (ref 13.0–17.0)
MCH: 28.8 pg (ref 26.0–34.0)
MCHC: 32.9 g/dL (ref 30.0–36.0)
MCV: 87.6 fL (ref 78.0–100.0)
Platelets: 124 10*3/uL — ABNORMAL LOW (ref 150–400)
RBC: 4.03 MIL/uL — ABNORMAL LOW (ref 4.22–5.81)
RDW: 13.8 % (ref 11.5–15.5)
WBC: 12.7 10*3/uL — ABNORMAL HIGH (ref 4.0–10.5)

## 2014-10-11 MED ORDER — DEXTROSE 5 % IV SOLN
2.0000 g | INTRAVENOUS | Status: DC
  Administered 2014-10-11: 2 g via INTRAVENOUS
  Filled 2014-10-11 (×2): qty 2

## 2014-10-11 NOTE — Progress Notes (Signed)
Physical Therapy Treatment Patient Details Name: David Stephens MRN: [id number] DOB: 1955/02/11 Today's Date: 10/11/2014    History of Present Illness Patient is a 59 y/o male s/p Rt TKA. PMH includes HLD.    PT Comments    David Stephens continues to be reluctant to WB through Rt LE but gradually increased WB w/ verbal cues to relax shoulders during ambulation.  Emerging step through gait pattern.  He demonstrated ability to safely ascend/descend 2 steps sideways but will need to practice full flight before d/c (see notes below).  Pt will benefit from continued skilled PT services to increase functional independence and safety.   Follow Up Recommendations  Home health PT;Supervision/Assistance - 24 hour     Equipment Recommendations  None recommended by PT    Recommendations for Other Services OT consult     Precautions / Restrictions Precautions Precautions: Knee;Fall Precaution Comments: Reviewed knee precautions Required Braces or Orthoses: Knee Immobilizer - Right Knee Immobilizer - Right: On when out of bed or walking Restrictions Weight Bearing Restrictions: Yes RLE Weight Bearing: Weight bearing as tolerated    Mobility  Bed Mobility Overal bed mobility: Modified Independent Bed Mobility: Supine to Sit     Supine to sit: Modified independent (Device/Increase time)     General bed mobility comments: Mod I w/ increased time. HOB flat and no use of bed rails to simulate home environment.  Transfers Overall transfer level: Needs assistance Equipment used: Rolling walker (2 wheeled) Transfers: Sit to/from Stand Sit to Stand: Min guard;From elevated surface         General transfer comment: Pt w/ good technique w/ proper hand placement.  Pt is slow to rise and requires cues to stand upright.  Pt reluctant to place weight through Rt LE initially.  Weight shifts performed EOB prior to ambulation.  Ambulation/Gait Ambulation/Gait assistance: Min  guard Ambulation Distance (Feet): 200 Feet Assistive device: Rolling walker (2 wheeled) Gait Pattern/deviations: Step-to pattern;Step-through pattern;Antalgic;Trunk flexed;Decreased weight shift to right;Decreased stride length;Decreased stance time - right   Gait velocity interpretation: Below normal speed for age/gender General Gait Details: Pt continues to WB heavily through RW but improves this session w/ cues to relax shoulders and gradually place more weight through Bil LEs.  Rt KI used this session 2/2 extension lag w/ SLR and pt's anxiety.   Stairs Stairs: Yes Stairs assistance: Min guard Stair Management: One rail Right;Step to pattern;Sideways Number of Stairs: 2 General stair comments: Pt attempts to cross over Rt LE to step up w/ it first despite verbal cues provided for proper technique.  Pt remains reluctant to place weight through Rt LE and encouragement provided.  Sideways technique will be used for pt to enter house.  Pt has full flight of steps to get up to bedroom/bathroom w/ a left rail.  Use of Lt rail w/ crutch may be best option and will be attempted when appropriate.  Wheelchair Mobility    Modified Rankin (Stroke Patients Only)       Balance Overall balance assessment: Needs assistance Sitting-balance support: Feet supported;Bilateral upper extremity supported Sitting balance-Leahy Scale: Good     Standing balance support: Bilateral upper extremity supported;During functional activity Standing balance-Leahy Scale: Poor Standing balance comment: Continues to rely heavily on RW                    Cognition Arousal/Alertness: Awake/alert Behavior During Therapy: Flat affect Overall Cognitive Status: Within Functional Limits for tasks assessed  Exercises Total Joint Exercises Ankle Circles/Pumps: Both;10 reps;Supine;AROM Quad Sets: Both;10 reps;Supine;Strengthening Straight Leg Raises: Strengthening;Right;10  reps;Supine;AAROM Knee Flexion: AROM;AAROM;Right;5 reps;Seated Goniometric ROM: 5-89    General Comments General comments (skin integrity, edema, etc.): Sons present during session.      Pertinent Vitals/Pain Pain Assessment: 0-10 Pain Score: 5  Pain Location: Rt Knee Pain Descriptors / Indicators: Aching;Constant;Discomfort;Grimacing Pain Intervention(s): Limited activity within patient's tolerance;Monitored during session;Repositioned    Home Living                      Prior Function            PT Goals (current goals can now be found in the care plan section) Acute Rehab PT Goals Patient Stated Goal: to go home tomorrow PT Goal Formulation: With patient Time For Goal Achievement: 10/23/14 Potential to Achieve Goals: Good Progress towards PT goals: Progressing toward goals    Frequency  7X/week    PT Plan Current plan remains appropriate    Co-evaluation             End of Session Equipment Utilized During Treatment: Gait belt Activity Tolerance: Patient limited by pain Patient left: with call bell/phone within reach;with family/visitor present;in chair     Time: 1120-1150 PT Time Calculation (min) (ACUTE ONLY): 30 min  Charges:  $Gait Training: 8-22 mins $Therapeutic Exercise: 8-22 mins                    G Codes:      Michail Jewels PT, [place] [phone] Pager: [phone] 10/11/2014, 1:07 PM

## 2014-10-11 NOTE — Progress Notes (Signed)
Physical Therapy Treatment Patient Details Name: David Stephens MRN: [id number] DOB: September 29, 1955 Today's Date: 10/11/2014    History of Present Illness Patient is a 59 y/o male s/p Rt TKA. PMH includes HLD.    PT Comments    Pain is limiting David Stephens, however he participated well this pm; on track for dc home tomorrow  Follow Up Recommendations  Home health PT;Supervision/Assistance - 24 hour     Equipment Recommendations  None recommended by PT    Recommendations for Other Services OT consult     Precautions / Restrictions Precautions Precautions: Knee;Fall Precaution Comments: Reviewed knee precautions Required Braces or Orthoses: Knee Immobilizer - Right Knee Immobilizer - Right: On when out of bed or walking Restrictions Weight Bearing Restrictions: Yes RLE Weight Bearing: Weight bearing as tolerated    Mobility  Bed Mobility                  Transfers Overall transfer level: Needs assistance Equipment used: Rolling walker (2 wheeled) Transfers: Sit to/from Stand Sit to Stand: Min guard         General transfer comment: Pt w/ good technique w/ proper hand placement.  Pt is slow to rise and requires cues to stand upright.  Cues for RLE positioning in prep to sit (because of KI)  Ambulation/Gait Ambulation/Gait assistance: Min guard (without physical contact) Ambulation Distance (Feet): 120 Feet Assistive device: Rolling walker (2 wheeled) Gait Pattern/deviations: Step-through pattern (emerging)   Gait velocity interpretation: Below normal speed for age/gender General Gait Details: Ntoing more weight acceptance RLE; needing cues to fully extend hips for upright standing; Cued also to activate R quad for stance stability   Stairs         General stair comments: We discussed stairs and pt expresses confidence in his ability to manage them  Wheelchair Mobility    Modified Rankin (Stroke Patients Only)        Balance     Sitting balance-Leahy Scale: Good     Standing balance support: Bilateral upper extremity supported Standing balance-Leahy Scale: Fair Standing balance comment: Either relies heavily on Rw or most of weight on LLE                    Cognition Arousal/Alertness: Awake/alert Behavior During Therapy: Flat affect Overall Cognitive Status: Within Functional Limits for tasks assessed                      Exercises Total Joint Exercises Quad Sets: 10 reps;Strengthening;Right Straight Leg Raises: AAROM;Right;10 reps Long Arc Quad: AAROM;Right;5 reps;Seated Knee Flexion: AROM;AAROM;Right;5 reps;Seated Goniometric ROM: flexion quite limited by pain this afternoon    General Comments        Pertinent Vitals/Pain Pain Assessment: 0-10 Pain Score: 5  Pain Location: Rt knee with walking Pain Descriptors / Indicators: Aching;Grimacing Pain Intervention(s): Limited activity within patient's Stephens;Monitored during session;RN gave pain meds during session    Home Living                      Prior Function            PT Goals (current goals can now be found in the care plan section) Acute Rehab PT Goals Patient Stated Goal: to go home tomorrow PT Goal Formulation: With patient Time For Goal Achievement: 10/23/14 Potential to Achieve Goals: Good Progress towards PT goals: Progressing toward goals    Frequency  7X/week    PT  Plan Current plan remains appropriate    Co-evaluation             End of Session Equipment Utilized During Treatment: Gait belt Activity Stephens: Patient limited by pain Patient left: in chair;with call bell/phone within reach (with dinner)     Time: 1610-9604 PT Time Calculation (min) (ACUTE ONLY): 31 min  Charges:  $Gait Training: 8-22 mins $Therapeutic Exercise: 8-22 mins                    G Codes:      Olen Pel 10/11/2014, 5:48 PM  Van Clines, [place]  Acute Rehabilitation  Services Pager [phone] Office [phone]

## 2014-10-11 NOTE — Progress Notes (Signed)
Subjective: 2 Days Post-Op Procedure(s) (LRB): TOTAL KNEE ARTHROPLASTY (Right) Patient reports pain as 8 on 0-10 scale.    Objective: Vital signs in last 24 hours: Temp:  [98.6 F (37 C)-101.3 F (38.5 C)] 100.5 F (38.1 C) (09/18 0505) Pulse Rate:  [91-108] 91 (09/18 0505) Resp:  [17-22] 17 (09/18 0505) BP: (150-188)/(73-87) 165/85 mmHg (09/18 0505) SpO2:  [94 %-98 %] 98 % (09/18 0505)  Intake/Output from previous day: 09/17 0701 - 09/18 0700 In: 360 [P.O.:360] Out: 1904 [Urine:1904] Intake/Output this shift:     Recent Labs  10/09/14 0618 10/09/14 1425 10/10/14 0624 10/11/14 0500  HGB 13.6 13.0 11.9* 11.6*    Recent Labs  10/10/14 0624 10/11/14 0500  WBC 14.2* 12.7*  RBC 4.17* 4.03*  HCT 36.0* 35.3*  PLT 158 124*    Recent Labs  10/10/14 0624 10/11/14 0500  NA 140 139  K 3.8 3.6  CL 109 107  CO2 24 24  BUN 13 11  CREATININE 1.49* 1.44*  GLUCOSE 105* 114*  CALCIUM 8.6* 8.3*   No results for input(s): LABPT, INR in the last 72 hours.  ABD soft Neurovascular intact Sensation intact distally Incision: dressing C/D/I  Assessment/Plan: 2 Days Post-Op Procedure(s) (LRB): TOTAL KNEE ARTHROPLASTY (Right) Up with therapy Plan for discharge tomorrow  NOt discharging today due to fever last night   Ordered UA and urine culture due to preop strep in his urine and fever.  Chest xray was negative.  Patient's states he is felling fine but he has significant difficulty with pain and weakness with any therapeutic exercise.  Patient refuses pain medication despite lots of encouragement  SHEPPERSON,KIRSTIN J 10/11/2014, 2:30 PM

## 2014-10-12 ENCOUNTER — Encounter (HOSPITAL_COMMUNITY): Payer: Self-pay | Admitting: General Practice

## 2014-10-12 LAB — BASIC METABOLIC PANEL
Anion gap: 7 (ref 5–15)
BUN: 12 mg/dL (ref 6–20)
CALCIUM: 8.2 mg/dL — AB (ref 8.9–10.3)
CO2: 27 mmol/L (ref 22–32)
CREATININE: 1.63 mg/dL — AB (ref 0.61–1.24)
Chloride: 101 mmol/L (ref 101–111)
GFR calc Af Amer: 52 mL/min — ABNORMAL LOW (ref >60)
GFR calc non Af Amer: 45 mL/min — ABNORMAL LOW (ref >60)
GLUCOSE: 105 mg/dL — AB (ref 65–99)
Potassium: 3.4 mmol/L — ABNORMAL LOW (ref 3.5–5.1)
Sodium: 135 mmol/L (ref 135–145)

## 2014-10-12 LAB — CBC
HEMATOCRIT: 31.4 % — AB (ref 39.0–52.0)
Hemoglobin: 10.2 g/dL — ABNORMAL LOW (ref 13.0–17.0)
MCH: 28.5 pg (ref 26.0–34.0)
MCHC: 32.5 g/dL (ref 30.0–36.0)
MCV: 87.7 fL (ref 78.0–100.0)
Platelets: 135 10*3/uL — ABNORMAL LOW (ref 150–400)
RBC: 3.58 MIL/uL — ABNORMAL LOW (ref 4.22–5.81)
RDW: 13.7 % (ref 11.5–15.5)
WBC: 12.8 10*3/uL — ABNORMAL HIGH (ref 4.0–10.5)

## 2014-10-12 LAB — URINE CULTURE: Culture: NO GROWTH

## 2014-10-12 NOTE — Discharge Summary (Signed)
PATIENT ID: David Stephens        MRN:  [id number]          DOB/AGE: 60-22-1957 / 59 y.o.    DISCHARGE SUMMARY  ADMISSION DATE:    10/09/2014 DISCHARGE DATE:   10/12/2014   ADMISSION DIAGNOSIS: OA RIGHT KNEE    DISCHARGE DIAGNOSIS:  OA RIGHT KNEE    ADDITIONAL DIAGNOSIS: Active Problems:   Primary localized osteoarthritis of right knee  Past Medical History  Diagnosis Date  . PONV (postoperative nausea and vomiting)   . Anginal pain   . Hyperlipidemia     patient states he takes medication for this but ran out and has stopped it.  . Kidney stones   . Inguinal hernia   . Umbilical hernia   . Arthritis     PROCEDURE: Procedure(s): TOTAL KNEE ARTHROPLASTY Right on 10/09/2014  CONSULTS: PT/OT  HISTORY:  See H&P in chart  HOSPITAL COURSE:  David Stephens is a 59 y.o. admitted on 10/09/2014 and found to have a diagnosis of OA RIGHT KNEE.  After appropriate laboratory studies were obtained  they were taken to the operating room on 10/09/2014 and underwent  Procedure(s): TOTAL KNEE ARTHROPLASTY  Right.   They were given perioperative antibiotics:  Anti-infectives    Start     Dose/Rate Route Frequency Ordered Stop   10/11/14 1700  cefTRIAXone (ROCEPHIN) 2 g in dextrose 5 % 50 mL IVPB     2 g 100 mL/hr over 30 Minutes Intravenous Every 24 hours 10/11/14 1454     10/09/14 1830  vancomycin (VANCOCIN) IVPB 1000 mg/200 mL premix     1,000 mg 200 mL/hr over 60 Minutes Intravenous Every 12 hours 10/09/14 1328 10/09/14 1807   10/09/14 0600  ceFAZolin (ANCEF) 3 g in dextrose 5 % 50 mL IVPB     3 g 160 mL/hr over 30 Minutes Intravenous On call to O.R. 10/08/14 1321 10/09/14 0734   10/09/14 0600  vancomycin (VANCOCIN) 1,500 mg in sodium chloride 0.9 % 500 mL IVPB     1,500 mg 250 mL/hr over 120 Minutes Intravenous On call to O.R. 10/08/14 1321 10/09/14 0815    .  Tolerated the procedure well.    POD #1, allowed out of bed to a chair.  PT for ambulation and exercise  program.  .  IV saline locked.  O2 discontionued.  POD #2, continued PT and ambulation.   Hemovac pulled.  Spiked fever overnight night before elevated WBC, ordered chest xray negative, urine recultured.  UA negative. .  The remainder of the hospital course was dedicated to ambulation and strengthening.   The patient was discharged on 3 Days Post-Op in  Stable condition.  Blood products given:none  DIAGNOSTIC STUDIES: Recent vital signs: Patient Vitals for the past 24 hrs:  BP Temp Temp src Pulse Resp SpO2  10/12/14 0556 (!) 148/76 mmHg 99.2 F (37.3 C) Oral 85 16 98 %  10/11/14 2038 136/62 mmHg 99.3 F (37.4 C) Oral 81 18 96 %  10/11/14 1431 137/71 mmHg 100 F (37.8 C) Oral 92 17 94 %       Recent laboratory studies:  Recent Labs  10/09/14 0618 10/09/14 1425 10/10/14 0624 10/11/14 0500 10/12/14 0500  WBC  --  13.3* 14.2* 12.7* 12.8*  HGB 13.6 13.0 11.9* 11.6* 10.2*  HCT 40.0 39.7 36.0* 35.3* 31.4*  PLT  --  150 158 124* 135*    Recent Labs  10/09/14 0618 10/09/14 1425 10/10/14 2725  10/11/14 0500 10/12/14 0500  NA 143  --  140 139 135  K 4.0  --  3.8 3.6 3.4*  CL 107  --  109 107 101  CO2  --   --  BUN 20  --  CREATININE 1.70* 1.56* 1.49* 1.44* 1.63*  GLUCOSE 99  --  105* 114* 105*  CALCIUM  --   --  8.6* 8.3* 8.2*   Lab Results  Component Value Date   INR 1.10 09/29/2014   INR 0.94 02/21/2010     Recent Radiographic Studies :  Dg Chest 2 View  09/29/2014   CLINICAL DATA:  Preop for right knee replacement  EXAM: CHEST  2 VIEW  COMPARISON:  None.  FINDINGS: No active infiltrate or effusion is seen. Mediastinal and hilar contours are unremarkable. The heart is within normal limits in size. No bony abnormality is seen.  IMPRESSION: No active cardiopulmonary disease.   Electronically Signed   By: Dwyane Dee M.D.   On: 09/29/2014 11:09   Dg Chest Port 1 View  10/10/2014   CLINICAL DATA:  Patient is febrile.  EXAM: PORTABLE CHEST - 1 VIEW   COMPARISON:  09/29/2014  FINDINGS: Cardiomediastinal silhouette is normal. Mediastinal contours appear intact.  There is no evidence of focal airspace consolidation, pleural effusion or pneumothorax. There is chronic elevation of the right hemidiaphragm. Left costophrenic angle is excluded by collimation, therefore evaluation for left pleural effusion cannot be performed.  Osseous structures are without acute abnormality. Soft tissues are grossly normal.  IMPRESSION: No radiographic evidence of acute cardiopulmonary abnormality.   Electronically Signed   By: Ted Mcalpine M.D.   On: 10/10/2014 21:24    DISCHARGE INSTRUCTIONS:   DISCHARGE MEDICATIONS:     Medication List    STOP taking these medications        ampicillin 500 MG capsule  Commonly known as:  PRINCIPEN      TAKE these medications        enoxaparin 30 MG/0.3ML injection  Commonly known as:  LOVENOX  Inject 0.3 mLs (30 mg total) into the skin every 12 (twelve) hours.     methocarbamol 500 MG tablet  Commonly known as:  ROBAXIN  Take 1 tablet (500 mg total) by mouth 4 (four) times daily.     oxyCODONE-acetaminophen 5-325 MG per tablet  Commonly known as:  ROXICET  Take 1-2 tablets by mouth every 4 (four) hours as needed for moderate pain or severe pain.        FOLLOW UP VISIT:       Follow-up Information    Follow up with CAFFREY JR,W D, MD. Schedule an appointment as soon as possible for a visit in 2 weeks.   Specialty:  Orthopedic Surgery   Contact information:   [address] [place] [address] [phone]       DISPOSITION:   Home  CONDITION:  Stable   Margart Sickles, PA-C  10/12/2014 8:30 AM

## 2014-10-12 NOTE — Progress Notes (Signed)
Subjective: 3 Days Post-Op Procedure(s) (LRB): TOTAL KNEE ARTHROPLASTY (Right) Patient reports pain as mild and moderate.    Objective: Vital signs in last 24 hours: Temp:  [99.2 F (37.3 C)-100 F (37.8 C)] 99.2 F (37.3 C) (09/19 0556) Pulse Rate:  [81-92] 85 (09/19 0556) Resp:  [16-18] 16 (09/19 0556) BP: (136-148)/(62-76) 148/76 mmHg (09/19 0556) SpO2:  [94 %-98 %] 98 % (09/19 0556)  Intake/Output from previous day: 09/18 0701 - 09/19 0700 In: 480 [P.O.:480] Out: 700 [Urine:700] Intake/Output this shift:     Recent Labs  10/09/14 1425 10/10/14 0624 10/11/14 0500 10/12/14 0500  HGB 13.0 11.9* 11.6* 10.2*    Recent Labs  10/11/14 0500 10/12/14 0500  WBC 12.7* 12.8*  RBC 4.03* 3.58*  HCT 35.3* 31.4*  PLT 124* 135*    Recent Labs  10/11/14 0500 10/12/14 0500  NA 139 135  K 3.6 3.4*  CL 107 101  CO2 24 27  BUN 11 12  CREATININE 1.44* 1.63*  GLUCOSE 114* 105*  CALCIUM 8.3* 8.2*   No results for input(s): LABPT, INR in the last 72 hours.  Sensation intact distally Intact pulses distally Dorsiflexion/Plantar flexion intact Incision: dressing C/D/I and dressing changed No cellulitis present Compartment soft  Assessment/Plan: 3 Days Post-Op Procedure(s) (LRB): TOTAL KNEE ARTHROPLASTY (Right) Up with therapy Discharge home with home health  Margart Sickles 10/12/2014, 8:43 AM

## 2014-10-12 NOTE — Progress Notes (Signed)
Physical Therapy Treatment Patient Details Name: David Stephens MRN: [id number] DOB: 03/09/1955 Today's Date: 10/12/2014    History of Present Illness Patient is a 59 y/o male s/p Rt TKA. PMH includes HLD.    PT Comments    Mr. Bramel continues to require cues to improve gait mechanics but demonstrates emerging step through gait pattern.  He is anticipating d/c today and is appropriate from a mobility standpoint for d/c.  He has decided that he will stay on the first floor of his home until he works w/ HHPT on safe ascending/descending of full flight at home, I agree w/ this plan in an effort to maximize pt's safety.  Pt will benefit from continued skilled PT services to increase functional independence and safety.   Follow Up Recommendations  Home health PT;Supervision/Assistance - 24 hour     Equipment Recommendations  None recommended by PT    Recommendations for Other Services OT consult     Precautions / Restrictions Precautions Precautions: Knee;Fall Precaution Comments: Reviewed knee precautions Required Braces or Orthoses: Knee Immobilizer - Right Knee Immobilizer - Right: On when out of bed or walking Restrictions Weight Bearing Restrictions: Yes RLE Weight Bearing: Weight bearing as tolerated    Mobility  Bed Mobility Overal bed mobility: Modified Independent Bed Mobility: Supine to Sit     Supine to sit: Modified independent (Device/Increase time)     General bed mobility comments: Mod I w/ increased time. HOB flat and no use of bed rails to simulate home environment.  Transfers Overall transfer level: Needs assistance Equipment used: Rolling walker (2 wheeled) Transfers: Sit to/from Stand Sit to Stand: Min guard         General transfer comment: Pt is slow to rise but demonstrates good technique w/ use of KI.   Ambulation/Gait Ambulation/Gait assistance: Min guard Ambulation Distance (Feet): 200 Feet Assistive device: Rolling walker (2  wheeled) Gait Pattern/deviations: Step-to pattern;Step-through pattern;Antalgic;Trunk flexed;Decreased weight shift to right;Decreased stride length;Decreased stance time - left   Gait velocity interpretation: Below normal speed for age/gender General Gait Details: Pt requires cues to stand upright and provided demonstration and verbal cues for step through gait pattern.  Pt w/ resultant emerging step through gait pattern.     Stairs            Wheelchair Mobility    Modified Rankin (Stroke Patients Only)       Balance Overall balance assessment: Needs assistance Sitting-balance support: No upper extremity supported;Feet supported Sitting balance-Leahy Scale: Good     Standing balance support: Bilateral upper extremity supported;During functional activity Standing balance-Leahy Scale: Fair                      Cognition Arousal/Alertness: Awake/alert Behavior During Therapy: Flat affect Overall Cognitive Status: Within Functional Limits for tasks assessed                      Exercises Total Joint Exercises Ankle Circles/Pumps: AROM;Both;10 reps;Seated Long Arc Quad: AAROM;Right;5 reps;Seated Knee Flexion: AROM;AAROM;Right;5 reps;Seated Goniometric ROM: 4-81 (flexion limited by pain)    General Comments        Pertinent Vitals/Pain Pain Assessment: 0-10 Pain Score: 4  Pain Location: Rt knee w/ mobility Pain Descriptors / Indicators: Aching;Sore Pain Intervention(s): Limited activity within patient's tolerance;Monitored during session;Repositioned    Home Living Family/patient expects to be discharged to:: Private residence Living Arrangements: Children  Prior Function            PT Goals (current goals can now be found in the care plan section) Acute Rehab PT Goals Patient Stated Goal: to go home today PT Goal Formulation: With patient Time For Goal Achievement: 10/23/14 Potential to Achieve Goals:  Good Progress towards PT goals: Progressing toward goals    Frequency  7X/week    PT Plan Current plan remains appropriate    Co-evaluation             End of Session Equipment Utilized During Treatment: Gait belt;Right knee immobilizer Activity Tolerance: Patient limited by pain;Patient tolerated treatment well Patient left: in chair;with call bell/phone within reach (with dinner)     Time: 1610-9604 PT Time Calculation (min) (ACUTE ONLY): 27 min  Charges:  $Gait Training: 8-22 mins $Therapeutic Exercise: 8-22 mins                    G Codes:      Michail Jewels PT, [place] [phone] Pager: [phone] 10/12/2014, 11:07 AM

## 2014-10-12 NOTE — Care Management Note (Signed)
Case Management Note  Patient Details  Name: David Stephens MRN: [id number] Date of Birth: 1955-03-23  Subjective/Objective:   59 yr old male s/p right total knee Arthroplasty.                 Action/Plan: Patient was preoperatively setup with [facility], no changes. Patient has family support at discharge. Has rolling walker and will not need 3in1 per therapist. CPM to be delivered to patient's home.  Expected Discharge Date: 10/12/14                 Expected Discharge Plan:  Home w Home Health Services  In-House Referral:     Discharge planning Services  CM Consult  Post Acute Care Choice:  Home Health Choice offered to:  Patient  DME Arranged:  CP[facility] DME Agency:  [facility] HH Arranged:  PT HH Agency:  [facility]  Status of Service:  Completed, signed off  Medicare Important Message Given:    Date Medicare IM Given:    Medicare IM give by:    Date Additional Medicare IM Given:    Additional Medicare Important Message give by:     If discussed at Long Length of Stay Meetings, dates discussed:    Additional Comments:  Durenda Guthrie, RN 10/12/2014, 11:26 AM

## 2014-10-13 ENCOUNTER — Encounter (HOSPITAL_COMMUNITY): Payer: Self-pay | Admitting: Orthopedic Surgery

## 2014-11-10 NOTE — Telephone Encounter (Signed)
error 

## 2014-12-25 ENCOUNTER — Encounter (HOSPITAL_COMMUNITY): Admission: RE | Payer: Self-pay | Source: Ambulatory Visit

## 2014-12-25 ENCOUNTER — Ambulatory Visit (HOSPITAL_COMMUNITY): Admission: RE | Admit: 2014-12-25 | Payer: 59 | Source: Ambulatory Visit | Admitting: Orthopedic Surgery

## 2014-12-25 SURGERY — ARTHROSCOPY, KNEE
Anesthesia: General | Laterality: Right

## 2015-07-09 ENCOUNTER — Ambulatory Visit (INDEPENDENT_AMBULATORY_CARE_PROVIDER_SITE_OTHER): Payer: Managed Care, Other (non HMO) | Admitting: Family Medicine

## 2015-07-09 VITALS — BP 124/78 | HR 64 | Temp 98.4°F | Resp 17 | Ht 73.0 in | Wt 261.0 lb

## 2015-07-09 DIAGNOSIS — J069 Acute upper respiratory infection, unspecified: Secondary | ICD-10-CM

## 2015-07-09 NOTE — Progress Notes (Addendum)
By signing my name below I, Shelah LewandowskyJoseph Thomas, attest that this documentation has been prepared under the direction and in the presence of Shade FloodJeffrey R Tache Bobst, MD. Electonically Signed. Shelah LewandowskyJoseph Thomas, Scribe 07/09/2015 at 12:31 PM  Subjective:    Patient ID: David Stephens, male    DOB: 20-Mar-1955, 60 y.o.   MRN: [id number]  Chief Complaint  Patient presents with  . Nasal Congestion  . Hot Flashes    HPI David Stephens is a 60 y.o. male who presents to the [facility] complaining of nasal congestion for the past 2 weeks. Pt states that he started sneezing initially and symptoms started to improve and resolved completely for 3 days until the past 4 days where pt has been having chills, fevers, nasal congestion, and productive cough. Pt has a productive cough. Pt states his symptoms have started to improve today. Pt's son has had a bad cough prior to pt's symptoms starting. Pt states that he has been sleeping well at night. Pt denies general body aches.   Pt has been taking over the counter Z-max with no relief.  Pt denies getting a flu shot this past year.   Patient Active Problem List   Diagnosis Date Noted  . Primary localized osteoarthritis of right knee 10/09/2014  . Pre-operative cardiovascular examination 10/04/2014  . Decreased cardiac ejection fraction 10/04/2014  . Essential hypertension 10/04/2014   Past Medical History  Diagnosis Date  . PONV (postoperative nausea and vomiting)   . Anginal pain (HCC)   . Hyperlipidemia     patient states he takes medication for this but ran out and has stopped it.  . Kidney stones   . Inguinal hernia   . Umbilical hernia   . Arthritis    Past Surgical History  Procedure Laterality Date  . Hernia repair    . Knee surgery Bilateral     several  . Bunionectomy    . Multiple tooth extraction    . Colonoscopy    . Nm myoview ltd  February 2009    No reversal perfusion defect to suggest ischemia. No infarct. Mild  global hypokinesis with EF 47%.  . 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 Clindamycin/Lincomycin Diarrhea   Prior to Admission medications   Medication Sig Start Date End Date Taking? Authorizing Provider  methocarbamol (ROBAXIN) 500 MG tablet Take 1 tablet (500 mg total) by mouth 4 (four) times daily. Patient not taking: Reported on 07/09/2015 10/09/14   Margart SicklesJoshua Chadwell, PA-C  oxyCODONE-acetaminophen (ROXICET) 5-325 MG per tablet Take 1-2 tablets by mouth every 4 (four) hours as needed for moderate pain or severe pain. Patient not taking: Reported on 07/09/2015 10/09/14   Margart SicklesJoshua Chadwell, PA-C   Social History   Social History  . Marital Status: Divorced    Spouse Name: N/A  . Number of Children: N/A  . Years of Education: N/A   Occupational History  . Not on file.   Social History Main Topics  . Smoking status: Never Smoker   . Smokeless tobacco: Never Used  . Alcohol Use: No  . Drug Use: No  . Sexual Activity: Not on file   Other Topics Concern  . Not on file   Social History Narrative      Review of Systems  Constitutional: Positive for fever and chills.  HENT: Positive for congestion.   Respiratory: Positive for cough.   Musculoskeletal: Negative for myalgias.  Psychiatric/Behavioral: Negative for sleep disturbance.       Objective:   Physical Exam  Constitutional: He is oriented to person, place, and time. He appears well-developed and well-nourished.  HENT:  Head: Normocephalic and atraumatic.  Right Ear: Tympanic membrane, external ear and ear canal normal.  Left Ear: Tympanic membrane, external ear and ear canal normal.  Nose: No rhinorrhea.  Mouth/Throat: Oropharynx is clear and moist and mucous membranes are normal. No oropharyngeal exudate or  posterior oropharyngeal erythema.  Eyes: Conjunctivae are normal. Pupils are equal, round, and reactive to light.  Neck: Neck supple.  Cardiovascular: Normal rate, regular rhythm, normal heart sounds and intact distal pulses.  Exam reveals no gallop and no friction rub.   No murmur heard. Pulmonary/Chest: Effort normal and breath sounds normal. No accessory muscle usage. He has no decreased breath sounds. He has no wheezes. He has no rhonchi. He has no rales.  Abdominal: Soft. There is no tenderness.  Lymphadenopathy:    He has no cervical adenopathy.  Neurological: He is alert and oriented to person, place, and time.  Skin: Skin is warm and dry.  Psychiatric: He has a normal mood and affect. His behavior is normal.  Vitals reviewed.    Filed Vitals:   07/09/15 1144  BP: 124/78  Pulse: 64  Temp: 98.4 F (36.9 C)  TempSrc: Oral  Resp: 17  Height:  (1.854 m)  Weight: 261 lb (118.389 kg)  SpO2: 97%        Assessment & Plan:   David Stephens is a 60 y.o. male Acute upper respiratory infection Suspected viral illness, including possible influenza, but beyond time of Tamiflu effectiveness. Testing was deferred. Reassuring exam and vitals. Symptomatic care discussed, RTC precautions given.  No orders of the defined types were placed in this encounter.   Patient Instructions       IF you received an x-ray today, you will receive an invoice from [facility]. Please contact [facility] at [phone] with questions or concerns regarding your invoice.   IF you received labwork today, you will receive an invoice from [employer]. Please contact [facility] at [phone] with questions or concerns regarding your invoice.   Our billing staff will not be able to assist you with questions regarding bills from these companies.  You will be contacted with the lab results as soon as they are available. The fastest way to get your  results is to activate your My Chart account. Instructions are located on the last page of this paperwork. If you have not heard from [place] regarding the results in 2 weeks, please contact this office.     Your lungs sounded clear today, and your exam is reassuring. You do not appear to have a pneumonia at this time. I suspect you picked up a different virus, and possible influenza. This should be improving into next week. If your symptoms are worsening, or increasing fevers into next week, return for recheck here or other medical provider. For now Mucinex or Mucinex DM during the day, NyQuil is okay at night, Tylenol or Advil if needed for body aches, drink plenty fluids and rest.  Upper Respiratory Infection, Adult Most upper respiratory infections (URIs) are a viral infection of the air passages leading to the lungs. A URI affects the nose, throat, and upper air passages. The  most common type of URI is nasopharyngitis and is typically referred to as "the common cold." URIs run their course and usually go away on their own. Most of the time, a URI does not require medical attention, but sometimes a bacterial infection in the upper airways can follow a viral infection. This is called a secondary infection. Sinus and middle ear infections are common types of secondary upper respiratory infections. Bacterial pneumonia can also complicate a URI. A URI can worsen asthma and chronic obstructive pulmonary disease (COPD). Sometimes, these complications can require emergency medical care and may be life threatening.  CAUSES Almost all URIs are caused by viruses. A virus is a type of germ and can spread from one person to another.  RISKS FACTORS You may be at risk for a URI if:   You smoke.   You have chronic heart or lung disease.  You have a weakened defense (immune) system.   You are very young or very old.   You have nasal allergies or asthma.  You work in crowded or poorly ventilated  areas.  You work in health care facilities or schools. SIGNS AND SYMPTOMS  Symptoms typically develop 2-3 days after you come in contact with a cold virus. Most viral URIs last 7-10 days. However, viral URIs from the influenza virus (flu virus) can last 14-18 days and are typically more severe. Symptoms may include:   Runny or stuffy (congested) nose.   Sneezing.   Cough.   Sore throat.   Headache.   Fatigue.   Fever.   Loss of appetite.   Pain in your forehead, behind your eyes, and over your cheekbones (sinus pain).  Muscle aches.  DIAGNOSIS  Your health care provider may diagnose a URI by:  Physical exam.  Tests to check that your symptoms are not due to another condition such as:  Strep throat.  Sinusitis.  Pneumonia.  Asthma. TREATMENT  A URI goes away on its own with time. It cannot be cured with medicines, but medicines may be prescribed or recommended to relieve symptoms. Medicines may help:  Reduce your fever.  Reduce your cough.  Relieve nasal congestion. HOME CARE INSTRUCTIONS   Take medicines only as directed by your health care provider.   Gargle warm saltwater or take cough drops to comfort your throat as directed by your health care provider.  Use a warm mist humidifier or inhale steam from a shower to increase air moisture. This may make it easier to breathe.  Drink enough fluid to keep your urine clear or pale yellow.   Eat soups and other clear broths and maintain good nutrition.   Rest as needed.   Return to work when your temperature has returned to normal or as your health care provider advises. You may need to stay home longer to avoid infecting others. You can also use a face mask and careful hand washing to prevent spread of the virus.  Increase the usage of your inhaler if you have asthma.   Do not use any tobacco products, including cigarettes, chewing tobacco, or electronic cigarettes. If you need help quitting,  ask your health care provider. PREVENTION  The best way to protect yourself from getting a cold is to practice good hygiene.   Avoid oral or hand contact with people with cold symptoms.   Wash your hands often if contact occurs.  There is no clear evidence that vitamin C, vitamin E, echinacea, or exercise reduces the chance of developing a cold.  However, it is always recommended to get plenty of rest, exercise, and practice good nutrition.  SEEK MEDICAL CARE IF:   You are getting worse rather than better.   Your symptoms are not controlled by medicine.   You have chills.  You have worsening shortness of breath.  You have brown or red mucus.  You have yellow or brown nasal discharge.  You have pain in your face, especially when you bend forward.  You have a fever.  You have swollen neck glands.  You have pain while swallowing.  You have white areas in the back of your throat. SEEK IMMEDIATE MEDICAL CARE IF:   You have severe or persistent:  Headache.  Ear pain.  Sinus pain.  Chest pain.  You have chronic lung disease and any of the following:  Wheezing.  Prolonged cough.  Coughing up blood.  A change in your usual mucus.  You have a stiff neck.  You have changes in your:  Vision.  Hearing.  Thinking.  Mood. MAKE SURE YOU:   Understand these instructions.  Will watch your condition.  Will get help right away if you are not doing well or get worse.   This information is not intended to replace advice given to you by your health care provider. Make sure you discuss any questions you have with your health care provider.   Document Released: 07/05/2000 Document Revised: 05/26/2014 Document Reviewed: 04/16/2013 Elsevier Interactive Patient Education [employer]    I personally performed the services described in this documentation, which was scribed in my presence. The recorded information has been reviewed and considered, and  addended by me as needed.   Signed,   Meredith Staggers, MD [facility].  07/13/2015 12:18 PM

## 2015-07-09 NOTE — Patient Instructions (Addendum)
IF you received an x-ray today, you will receive an invoice from [facility]. Please contact [facility] at [phone] with questions or concerns regarding your invoice.   IF you received labwork today, you will receive an invoice from [employer]. Please contact [facility] at [phone] with questions or concerns regarding your invoice.   Our billing staff will not be able to assist you with questions regarding bills from these companies.  You will be contacted with the lab results as soon as they are available. The fastest way to get your results is to activate your My Chart account. Instructions are located on the last page of this paperwork. If you have not heard from [place] regarding the results in 2 weeks, please contact this office.     Your lungs sounded clear today, and your exam is reassuring. You do not appear to have a pneumonia at this time. I suspect you picked up a different virus, and possible influenza. This should be improving into next week. If your symptoms are worsening, or increasing fevers into next week, return for recheck here or other medical provider. For now Mucinex or Mucinex DM during the day, NyQuil is okay at night, Tylenol or Advil if needed for body aches, drink plenty fluids and rest.  Upper Respiratory Infection, Adult Most upper respiratory infections (URIs) are a viral infection of the air passages leading to the lungs. A URI affects the nose, throat, and upper air passages. The most common type of URI is nasopharyngitis and is typically referred to as "the common cold." URIs run their course and usually go away on their own. Most of the time, a URI does not require medical attention, but sometimes a bacterial infection in the upper airways can follow a viral infection. This is called a secondary infection. Sinus and middle ear infections are common types of secondary upper respiratory infections. Bacterial  pneumonia can also complicate a URI. A URI can worsen asthma and chronic obstructive pulmonary disease (COPD). Sometimes, these complications can require emergency medical care and may be life threatening.  CAUSES Almost all URIs are caused by viruses. A virus is a type of germ and can spread from one person to another.  RISKS FACTORS You may be at risk for a URI if:   You smoke.   You have chronic heart or lung disease.  You have a weakened defense (immune) system.   You are very young or very old.   You have nasal allergies or asthma.  You work in crowded or poorly ventilated areas.  You work in health care facilities or schools. SIGNS AND SYMPTOMS  Symptoms typically develop 2-3 days after you come in contact with a cold virus. Most viral URIs last 7-10 days. However, viral URIs from the influenza virus (flu virus) can last 14-18 days and are typically more severe. Symptoms may include:   Runny or stuffy (congested) nose.   Sneezing.   Cough.   Sore throat.   Headache.   Fatigue.   Fever.   Loss of appetite.   Pain in your forehead, behind your eyes, and over your cheekbones (sinus pain).  Muscle aches.  DIAGNOSIS  Your health care provider may diagnose a URI by:  Physical exam.  Tests to check that your symptoms are not due to another condition such as:  Strep throat.  Sinusitis.  Pneumonia.  Asthma. TREATMENT  A URI goes away on its own with time. It cannot be cured with  medicines, but medicines may be prescribed or recommended to relieve symptoms. Medicines may help:  Reduce your fever.  Reduce your cough.  Relieve nasal congestion. HOME CARE INSTRUCTIONS   Take medicines only as directed by your health care provider.   Gargle warm saltwater or take cough drops to comfort your throat as directed by your health care provider.  Use a warm mist humidifier or inhale steam from a shower to increase air moisture. This may make it  easier to breathe.  Drink enough fluid to keep your urine clear or pale yellow.   Eat soups and other clear broths and maintain good nutrition.   Rest as needed.   Return to work when your temperature has returned to normal or as your health care provider advises. You may need to stay home longer to avoid infecting others. You can also use a face mask and careful hand washing to prevent spread of the virus.  Increase the usage of your inhaler if you have asthma.   Do not use any tobacco products, including cigarettes, chewing tobacco, or electronic cigarettes. If you need help quitting, ask your health care provider. PREVENTION  The best way to protect yourself from getting a cold is to practice good hygiene.   Avoid oral or hand contact with people with cold symptoms.   Wash your hands often if contact occurs.  There is no clear evidence that vitamin C, vitamin E, echinacea, or exercise reduces the chance of developing a cold. However, it is always recommended to get plenty of rest, exercise, and practice good nutrition.  SEEK MEDICAL CARE IF:   You are getting worse rather than better.   Your symptoms are not controlled by medicine.   You have chills.  You have worsening shortness of breath.  You have brown or red mucus.  You have yellow or brown nasal discharge.  You have pain in your face, especially when you bend forward.  You have a fever.  You have swollen neck glands.  You have pain while swallowing.  You have white areas in the back of your throat. SEEK IMMEDIATE MEDICAL CARE IF:   You have severe or persistent:  Headache.  Ear pain.  Sinus pain.  Chest pain.  You have chronic lung disease and any of the following:  Wheezing.  Prolonged cough.  Coughing up blood.  A change in your usual mucus.  You have a stiff neck.  You have changes in your:  Vision.  Hearing.  Thinking.  Mood. MAKE SURE YOU:   Understand these  instructions.  Will watch your condition.  Will get help right away if you are not doing well or get worse.   This information is not intended to replace advice given to you by your health care provider. Make sure you discuss any questions you have with your health care provider.   Document Released: 07/05/2000 Document Revised: 05/26/2014 Document Reviewed: 04/16/2013 Elsevier Interactive Patient Education [employer] Elsevier Inc[employer]

## 2015-10-15 ENCOUNTER — Other Ambulatory Visit: Payer: Self-pay | Admitting: Family Medicine

## 2015-10-15 DIAGNOSIS — R300 Dysuria: Secondary | ICD-10-CM

## 2015-11-08 ENCOUNTER — Ambulatory Visit
Admission: RE | Admit: 2015-11-08 | Discharge: 2015-11-08 | Disposition: A | Discharge status: Home / Self Care | Payer: 59 | Source: Ambulatory Visit | Attending: Family Medicine | Admitting: Family Medicine

## 2015-11-08 DIAGNOSIS — R300 Dysuria: Secondary | ICD-10-CM

## 2015-11-19 ENCOUNTER — Other Ambulatory Visit: Payer: 59

## 2016-09-04 ENCOUNTER — Ambulatory Visit (INDEPENDENT_AMBULATORY_CARE_PROVIDER_SITE_OTHER): Payer: 59 | Admitting: Podiatry

## 2016-09-04 ENCOUNTER — Encounter: Payer: Self-pay | Admitting: Podiatry

## 2016-09-04 ENCOUNTER — Ambulatory Visit (INDEPENDENT_AMBULATORY_CARE_PROVIDER_SITE_OTHER): Payer: 59

## 2016-09-04 VITALS — BP 144/81 | HR 59 | Resp 18

## 2016-09-04 DIAGNOSIS — M722 Plantar fascial fibromatosis: Secondary | ICD-10-CM

## 2016-09-04 NOTE — Progress Notes (Signed)
   Subjective:    Patient ID: David Stephens, male    DOB: Mar 02, 1955, 61 y.o.   MRN: [id number]  HPI  61 year old male persists the office if concerns of pain to the bottom of the right heel which is been ongoing for about 2 weeks. He states that he notices she is well worn out he did purchase new shoes. Continues throbbing pain to the bottom of the heel and into the arch somewhat. Denies any recent injury or traumas. No swelling or redness of the area. No numbness or tingling. The pain does not wake him up at night. He is also been using a frozen water bottle as well as foot which has been helping some. He has no other complaints today.  Review of Systems  HENT: Positive for sneezing.   Respiratory: Positive for cough and wheezing.   All other systems reviewed and are negative.      Objective:   Physical Exam General: AAO x3, NAD  Dermatological: Skin is warm, dry and supple bilateral. Nails x 10 are well manicured; remaining integument appears unremarkable at this time. There are no open sores, no preulcerative lesions, no rash or signs of infection present.  Vascular: Dorsalis Pedis artery and Posterior Tibial artery pedal pulses are 2/4 bilateral with immedate capillary fill time.  There is no pain with calf compression, swelling, warmth, erythema.   Neruologic: Grossly intact via light touch bilateral. Protective threshold with Semmes Wienstein monofilament intact to all pedal sites bilateral. Negative tinel sign.   Musculoskeletal: Tenderness to palpation along the plantar medial tubercle of the calcaneus at the insertion of plantar fascia on the right foot. There is no pain along the course of the plantar fascia within the arch of the foot. Plantar fascia appears to be intact. There is no pain with lateral compression of the calcaneus or pain with vibratory sensation. There is no pain along the course or insertion of the achilles tendon. No other areas of tenderness to bilateral  lower extremities.  Muscular strength 5/5 in all groups tested bilateral.  Gait: Unassisted, Nonantalgic.      Assessment & Plan:  -Treatment options discussed including all alternatives, risks, and complications -Etiology of symptoms were discussed -X-rays were obtained and reviewed with the patient. No evidence of acute fracture identified. -Patient elects to proceed with steroid injection into the right heel. Under sterile skin preparation, a total of 2.5cc of kenalog 10, 0.5% Marcaine plain, and 2% lidocaine plain were infiltrated into the symptomatic area without complication. A band-aid was applied. Patient tolerated the injection well without complication. Post-injection care with discussed with the patient. Discussed with the patient to ice the area over the next couple of days to help prevent a steroid flare.  -Continue stretching, icing exercises daily. -Anti-inflammatories as needed -He started change shoes which include beneficial for him. Also discussed inserts. -Follow-up in 3 weeks if symptoms continue or sooner if needed.  Ovid CurdMatthew Wagoner, DPM

## 2016-09-06 DIAGNOSIS — M722 Plantar fascial fibromatosis: Secondary | ICD-10-CM | POA: Insufficient documentation

## 2016-10-05 ENCOUNTER — Ambulatory Visit: Payer: 59 | Admitting: Podiatry

## 2016-11-15 IMAGING — CR DG CHEST 2V
2 series · 2 of 2 positions shown · non-contrast
Comparison: None.

CLINICAL DATA: Preop for right knee replacement

EXAM:
CHEST  2 VIEW

[w chest pa]
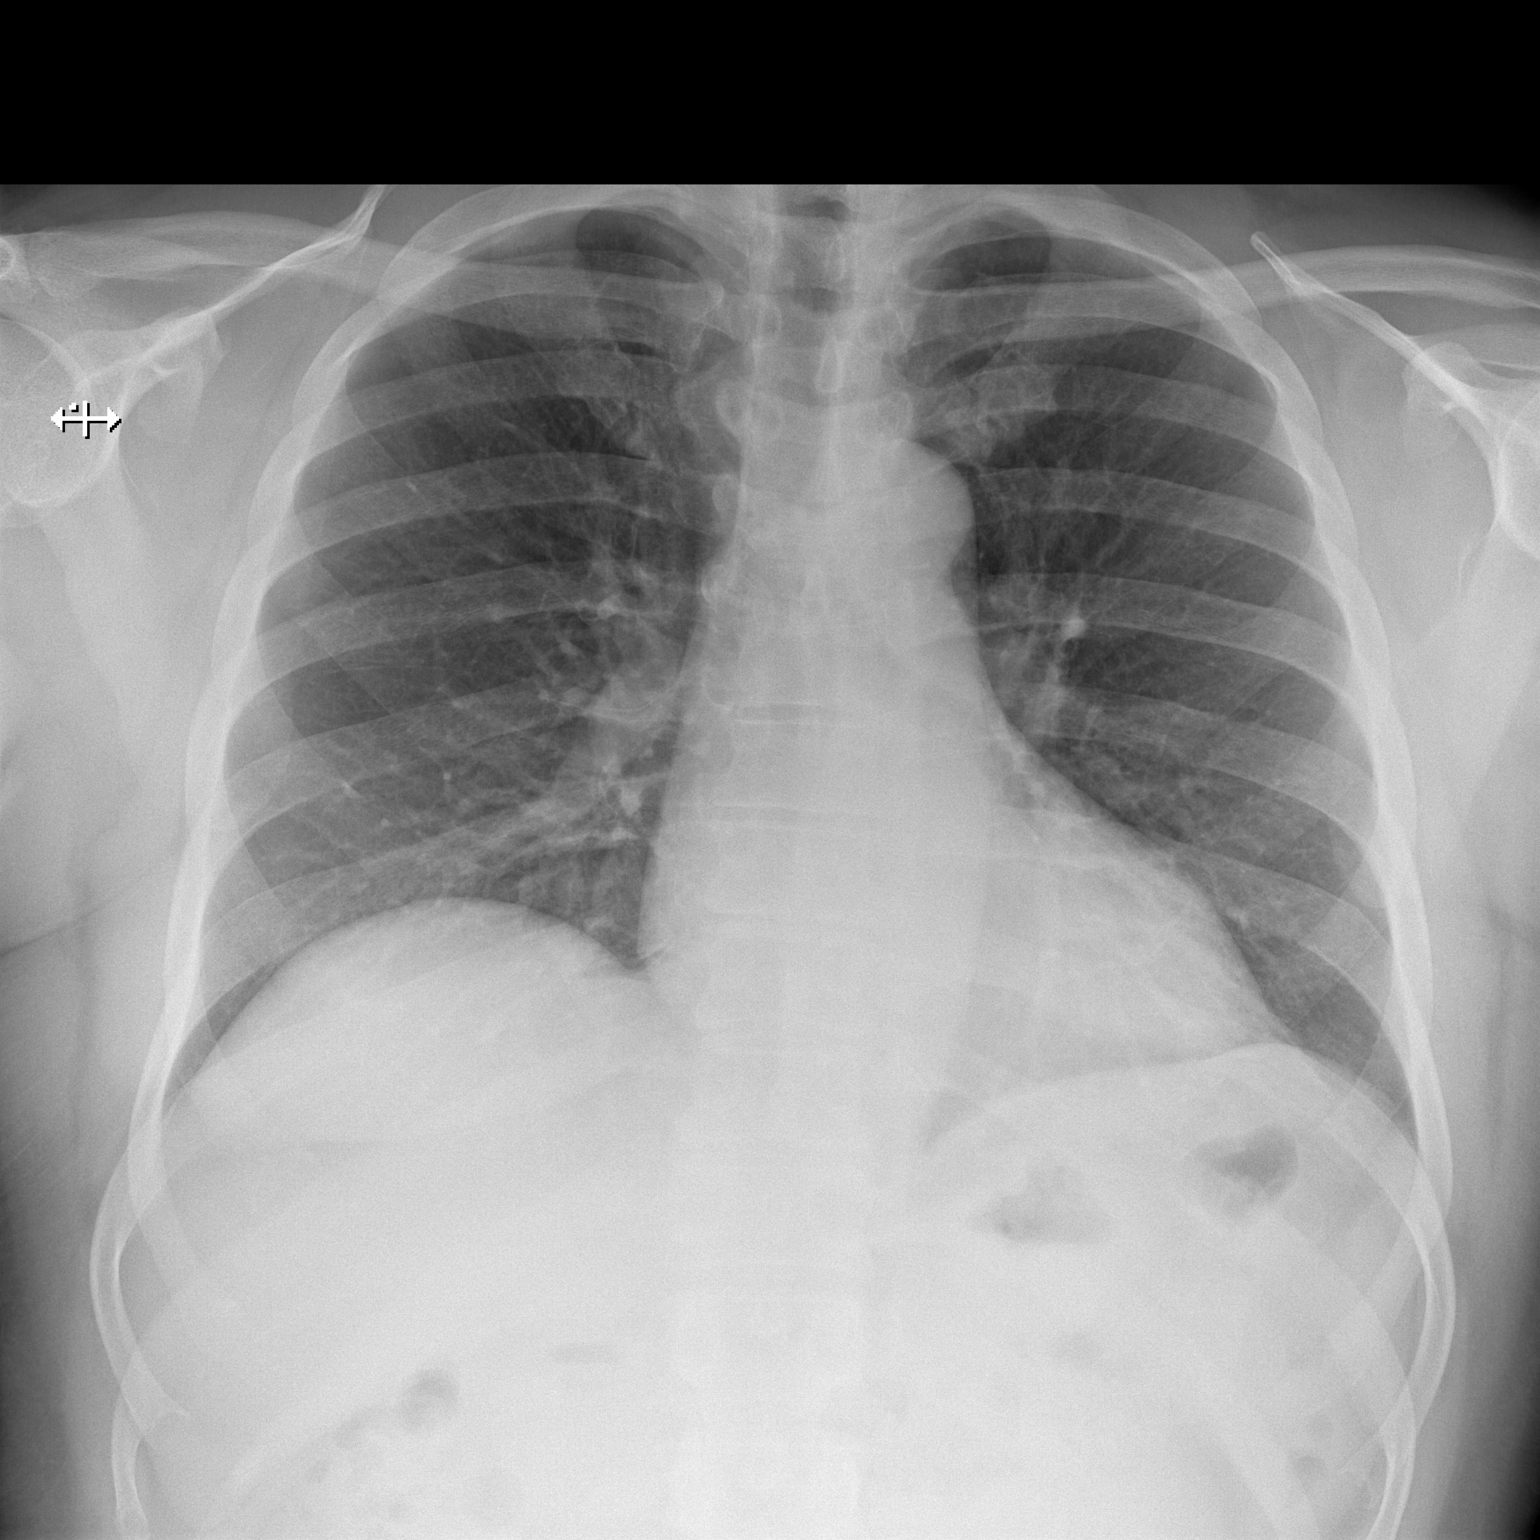

[w chest lat]
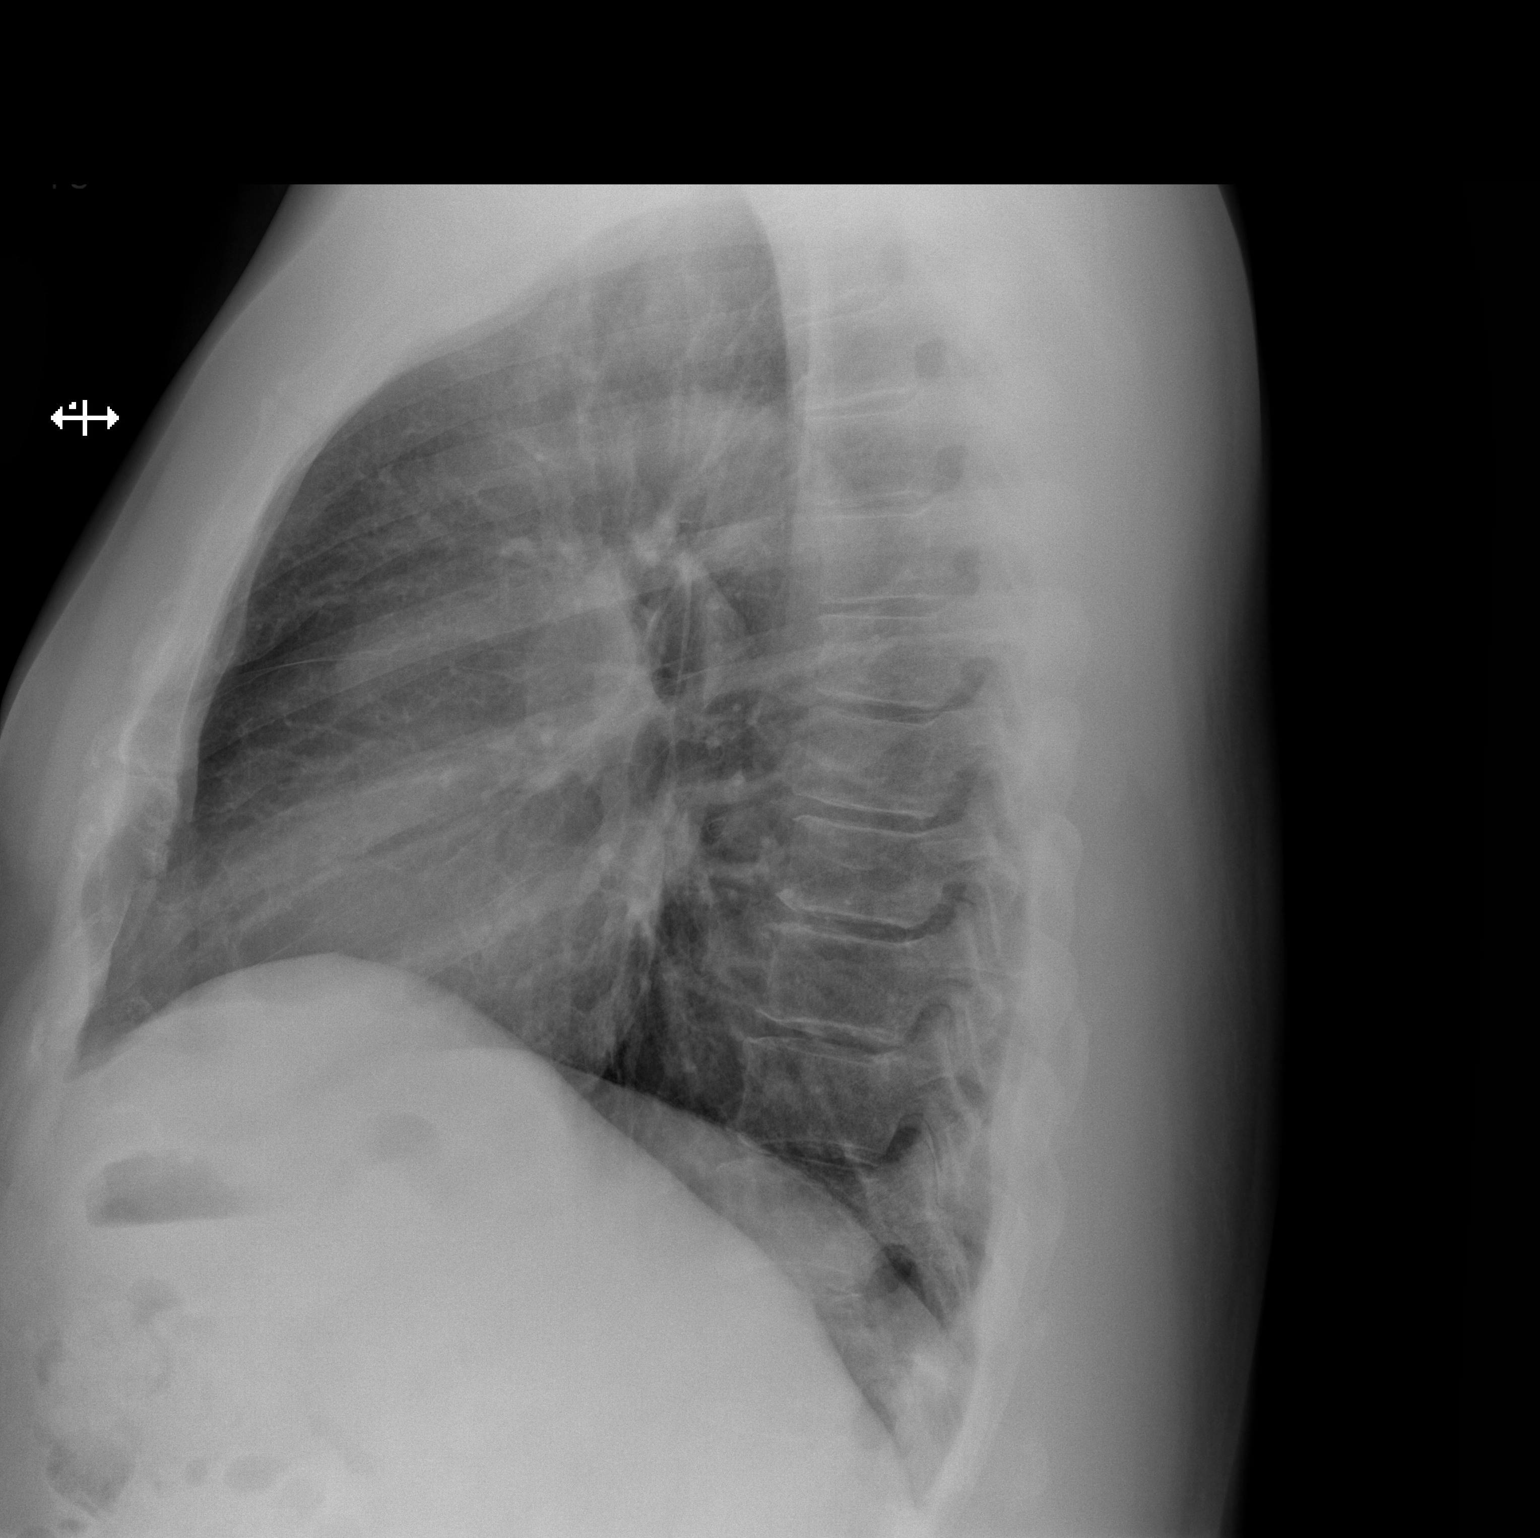

[2 of 2 positions shown; findings below may reference images not displayed]

FINDINGS: No active infiltrate or effusion is seen. Mediastinal and hilar
contours are unremarkable. The heart is within normal limits in
size. No bony abnormality is seen.
IMPRESSION: No active cardiopulmonary disease.

## 2019-09-08 ENCOUNTER — Other Ambulatory Visit (HOSPITAL_COMMUNITY): Payer: Self-pay | Admitting: Family Medicine

## 2019-09-08 DIAGNOSIS — R011 Cardiac murmur, unspecified: Secondary | ICD-10-CM

## 2019-09-24 ENCOUNTER — Ambulatory Visit (HOSPITAL_COMMUNITY): Payer: 59 | Attending: Family Medicine

## 2019-09-24 ENCOUNTER — Other Ambulatory Visit: Payer: Self-pay

## 2019-09-24 DIAGNOSIS — R011 Cardiac murmur, unspecified: Secondary | ICD-10-CM

## 2019-09-24 LAB — ECHOCARDIOGRAM COMPLETE
AR max vel: 1.69 cm2
AV Area VTI: 1.8 cm2
AV Area mean vel: 1.86 cm2
AV Mean grad: 8.6 mmHg
AV Peak grad: 17.7 mmHg
Ao pk vel: 2.1 m/s
Area-P 1/2: 2.74 cm2
S' Lateral: 3.3 cm

## 2019-10-16 NOTE — Progress Notes (Addendum)
Cardiology Office Note:    Date:  10/17/2019   ID:  David, Stephens [birth year], MRN [id number]  PCP:  [facility] Cardiologist:  No primary care provider on file.  [facility] Electrophysiologist:  None   Referring MD: Juluis Rainier, MD    History of Present Illness:    David Stephens is a 64 y.o. male with a hx of hypertension with evidence of severe LVH on TTE, mild aortic stenosis, and hyperlipidemia who was referred by Dr. Zachery Dauer for concern for abnormal TTE and heart murmur on exam.  The patient states that he is overall very healthy. He walks 2-20miles/day without issues. No chest pain, shortness of breath, dyspnea on exertion, palpitations, dizziness or syncope. He does not monitor his blood pressure at home but states he has been told it "runs high." He reports that he never has been told he has a murmur and has had thorough physical exams in the past. Denies any personal history of cardiovascular disease.   Past Medical History:  Diagnosis Date  . Anginal pain (HCC)   . Arthritis   . Hyperlipidemia    patient states he takes medication for this but ran out and has stopped it.  . Inguinal hernia   . Kidney stones   . PONV (postoperative nausea and vomiting)   . Umbilical hernia     Past Surgical History:  Procedure Laterality Date  . BUNIONECTOMY    . COLONOSCOPY    . HERNIA REPAIR    . KNEE SURGERY Bilateral    several  . multiple tooth extraction    . NM MYOVIEW LTD  February 2009   No reversal perfusion defect to suggest ischemia. No infarct. Mild global hypokinesis with EF 47%.  . TOTAL KNEE ARTHROPLASTY Right 10/09/2014  . TOTAL KNEE ARTHROPLASTY Right 10/09/2014   Procedure: TOTAL KNEE ARTHROPLASTY;  Surgeon: Frederico Hamman, MD;  Location: [facility] OR;  Service: Orthopedics;  Laterality: Right;    Current Medications: Current Meds  Medication Sig  . amoxicillin (AMOXIL) 500 MG tablet Take 500 mg by  mouth 2 (two) times daily. For Dental use prior to appointments  . atorvastatin (LIPITOR) 40 MG tablet Take 40 mg by mouth daily.  Marland Kitchen losartan (COZAAR) 25 MG tablet Take 25 mg by mouth daily.  . [DISCONTINUED] amLODipine (NORVASC) 5 MG tablet Take 5 mg by mouth daily.     Allergies:   Clindamycin/lincomycin   Social History   Socioeconomic History  . Marital status: Divorced    Spouse name: Not on file  . Number of children: Not on file  . Years of education: Not on file  . Highest education level: Not on file  Occupational History  . Not on file  Tobacco Use  . Smoking status: Never Smoker  . Smokeless tobacco: Never Used  Substance and Sexual Activity  . Alcohol use: No  . Drug use: No  . Sexual activity: Not on file  Other Topics Concern  . Not on file  Social History Narrative  . Not on file   Social Determinants of Health   Financial Resource Strain:   . Difficulty of Paying Living Expenses: Not on file  Food Insecurity:   . Worried About Programme researcher, broadcasting/film/video in the Last Year: Not on file  . Ran Out of Food in the Last Year: Not on file  Transportation Needs:   . Lack of Transportation (Medical): Not on file  . Lack  of Transportation (Non-Medical): Not on file  Physical Activity:   . Days of Exercise per Week: Not on file  . Minutes of Exercise per Session: Not on file  Stress:   . Feeling of Stress : Not on file  Social Connections:   . Frequency of Communication with Friends and Family: Not on file  . Frequency of Social Gatherings with Friends and Family: Not on file  . Attends Religious Services: Not on file  . Active Member of Clubs or Organizations: Not on file  . Attends BankerClub or Organization Meetings: Not on file  . Marital Status: Not on file     Family History: The patient's family history is not on file.  ROS:   Please see the history of present illness.    The patient denies chest pain, chest pressure, dyspnea at rest or with exertion,  palpitations, PND, orthopnea, or leg swelling. Denies cough, fever, chills. Denies nausea, vomiting. Denies syncope or presyncope. Denies dizziness or lightheadedness. Denies snoring.  EKGs/Labs/Other Studies Reviewed:    The following studies were reviewed today: TTE 09/24/19: 1. Left ventricular ejection fraction, by estimation, is 60 to 65%. The  left ventricle has normal function. The left ventricle has no regional  wall motion abnormalities. There is severe left ventricular hypertrophy.  Left ventricular diastolic parameters  were normal.  2. Right ventricular systolic function is normal. The right ventricular  size is normal. There is normal pulmonary artery systolic pressure.  3. Left atrial size was mildly dilated.  4. The mitral valve is normal in structure. Trivial mitral valve  regurgitation. No evidence of mitral stenosis.  5. The aortic valve is tricuspid. Aortic valve regurgitation is trivial.  Mild aortic valve stenosis.  6. Aortic dilatation noted. There is mild dilatation of the ascending  aorta measuring 38 mm.  7. The inferior vena cava is normal in size with greater than 50%  respiratory variability, suggesting right atrial pressure of 3 mmHg.   TTE 09/2014: - Left ventricle: The cavity size was normal. Wall thickness was  increased in a pattern of moderate LVH. Systolic function was  normal. The estimated ejection fraction was in the range of 60%  to 65%.  - Aortic valve: There was trivial regurgitation.  - Mitral valve: There was mild regurgitation.  - Left atrium: The atrium was mildly to moderately dilated.  Nuclear stress 2009: IMPRESSION:  1. No areas of reversibility to suggest inducible ischemia.  2. Mild global hypokinesis.  3. Ejection fraction estimated at 47%.   EKG:  EKG is ordered today.  The ekg ordered today demonstrates normal sinus rhythm with heart rate 66. Occasional PACs. Some early repol.  Recent Labs: No results found  for requested labs within last 8760 hours.  Recent Lipid Panel No results found for: CHOL, TRIG, HDL, CHOLHDL, VLDL, LDLCALC, LDLDIRECT  Physical Exam:    VS:  BP (!) 158/82   Pulse 66   Ht 6\' 1"  (1.854 m)   Wt 262 lb 6.4 oz (119 kg)   SpO2 97%   BMI 34.62 kg/m     Wt Readings from Last 3 Encounters:  10/17/19 262 lb 6.4 oz (119 kg)  07/09/15 261 lb (118.4 kg)  10/09/14 270 lb (122.5 kg)     GEN:  Well nourished, well developed in no acute distress HEENT: Normal NECK: No JVD; No carotid bruits LYMPHATICS: No lymphadenopathy CARDIAC: RRR, 2+ systolic murmur best heard at RUSB RESPIRATORY:  Clear to auscultation without rales, wheezing or rhonchi  ABDOMEN: Soft, non-tender, non-distended MUSCULOSKELETAL:  No edema; No deformity  SKIN: Warm and dry NEUROLOGIC:  Alert and oriented x 3 PSYCHIATRIC:  Normal affect   ASSESSMENT:    1. Essential hypertension   2. Mild aortic stenosis   3. Murmur, cardiac   4. Pure hypercholesterolemia   5. Ascending aorta dilatation (HCC)    PLAN:    In order of problems listed above:  #Heart Murmur: #Mild Aortic Stenosis Aortic valve mean gradient measures 8.6 mmHg. Aortic valve peak gradient measures 17.7 mmHg. Aortic valve area, by VTI measures 1.80 cm.   -Asymptomatic -Will need surveillance TTE every 3-5 years  #Hypertension with severe LVH on TTE: Patient with moderate-to-severe LVH on TTE which is suggestive of poorly controlled HTN at home. BP here elevated to 150s. Discussed importance of monitoring home blood pressures and patient agreed to getting blood pressure cuff. -Discussed importance of monitoring blood pressure at home at length and patient agreed to purchase blood pressure cuff. Goal SBP<130. -Increase amlodipine to 10mg  daily -Increase losartan 50mg  daily -Repeat labs with PCP to monitor renal function and K  #Hyperlipidemia Well controlled. LDL 76. HDL 44. -Managed by PCP -Continue atorvastatin 40mg   #Mild  ascending aortic dilation (3.8cm) -Will monitor with serial annual imaging; plan for CT vs MR next year  -Need aggressive blood pressure control -Continue atorvastatin -Avoid heavy weight lifting  Medication Adjustments/Labs and Tests Ordered: Current medicines are reviewed at length with the patient today.  Concerns regarding medicines are outlined above.  Orders Placed This Encounter  Procedures  . EKG 12-Lead   Meds ordered this encounter  Medications  . amLODipine (NORVASC) 10 MG tablet    Sig: Take 1 tablet (10 mg total) by mouth daily.    Dispense:  180 tablet    Refill:  3  . losartan (COZAAR) 50 MG tablet    Sig: Take 1 tablet (50 mg total) by mouth daily.    Dispense:  90 tablet    Refill:  3    Patient Instructions  Medication Instructions:  Your physician has recommended you make the following change in your medication: increase your Losartan to 50 mg a day  Increase your Amlodipine to 10 mg a day  *If you need a refill on your cardiac medications before your next appointment, please call your pharmacy*   Lab Work: none If you have labs (blood work) drawn today and your tests are completely normal, you will receive your results only by: MyChart Message (if you have MyChart) OR . A paper copy in the mail If you have any lab test that is abnormal or we need to change your treatment, we will call you to review the results.   Testing/Procedures: none   Follow-Up: At [facility], you and your health needs are our priority.  As part of our continuing mission to provide you with exceptional heart care, we have created designated Provider Care Teams.  These Care Teams include your primary Cardiologist (physician) and Advanced Practice Providers (APPs -  Physician Assistants and Nurse Practitioners) who all work together to provide you with the care you need, when you need it.  We recommend signing up for the patient portal called "MyChart".  Sign up information  is provided on this After Visit Summary.  MyChart is used to connect with patients for Virtual Visits (Telemedicine).  Patients are able to view lab/test results, encounter notes, upcoming appointments, etc.  Non-urgent messages can be sent to your provider as well.  To learn more about what you can do with MyChart, go to [email or web address].    Your next appointment:   1 year(s)  The format for your next appointment:   In Person  Provider:   Laurance Flatten, MD   Other Instructions      Signed, Meriam Sprague, MD  10/17/2019 9:21 AM    [facility]

## 2019-10-17 ENCOUNTER — Encounter: Payer: Self-pay | Admitting: Cardiology

## 2019-10-17 ENCOUNTER — Other Ambulatory Visit: Payer: Self-pay

## 2019-10-17 ENCOUNTER — Ambulatory Visit (INDEPENDENT_AMBULATORY_CARE_PROVIDER_SITE_OTHER): Payer: 59 | Admitting: Cardiology

## 2019-10-17 VITALS — BP 158/82 | HR 66 | Ht 73.0 in | Wt 262.4 lb

## 2019-10-17 DIAGNOSIS — I350 Nonrheumatic aortic (valve) stenosis: Secondary | ICD-10-CM | POA: Diagnosis not present

## 2019-10-17 DIAGNOSIS — R011 Cardiac murmur, unspecified: Secondary | ICD-10-CM

## 2019-10-17 DIAGNOSIS — E7800 Pure hypercholesterolemia, unspecified: Secondary | ICD-10-CM | POA: Diagnosis not present

## 2019-10-17 DIAGNOSIS — I77810 Thoracic aortic ectasia: Secondary | ICD-10-CM

## 2019-10-17 DIAGNOSIS — I10 Essential (primary) hypertension: Secondary | ICD-10-CM

## 2019-10-17 MED ORDER — LOSARTAN POTASSIUM 50 MG PO TABS: 50.0000 mg | ORAL_TABLET | Freq: Every day | ORAL | 3 refills | Status: DC

## 2019-10-17 MED ORDER — AMLODIPINE BESYLATE 10 MG PO TABS: 10.0000 mg | ORAL_TABLET | Freq: Every day | ORAL | 3 refills | Status: DC

## 2019-10-17 NOTE — Patient Instructions (Signed)
Medication Instructions:  Your physician has recommended you make the following change in your medication: increase your Losartan to 50 mg a day  Increase your Amlodipine to 10 mg a day  *If you need a refill on your cardiac medications before your next appointment, please call your pharmacy*   Lab Work: none If you have labs (blood work) drawn today and your tests are completely normal, you will receive your results only by: Marland Kitchen MyChart Message (if you have MyChart) OR . A paper copy in the mail If you have any lab test that is abnormal or we need to change your treatment, we will call you to review the results.   Testing/Procedures: none   Follow-Up: At [facility], you and your health needs are our priority.  As part of our continuing mission to provide you with exceptional heart care, we have created designated Provider Care Teams.  These Care Teams include your primary Cardiologist (physician) and Advanced Practice Providers (APPs -  Physician Assistants and Nurse Practitioners) who all work together to provide you with the care you need, when you need it.  We recommend signing up for the patient portal called "MyChart".  Sign up information is provided on this After Visit Summary.  MyChart is used to connect with patients for Virtual Visits (Telemedicine).  Patients are able to view lab/test results, encounter notes, upcoming appointments, etc.  Non-urgent messages can be sent to your provider as well.   To learn more about what you can do with MyChart, go to [email or web address].    Your next appointment:   1 year(s)  The format for your next appointment:   In Person  Provider:   Laurance Flatten, MD   Other Instructions

## 2020-10-26 ENCOUNTER — Other Ambulatory Visit: Payer: Self-pay | Admitting: *Deleted

## 2020-10-26 MED ORDER — LOSARTAN POTASSIUM 50 MG PO TABS: 50.0000 mg | ORAL_TABLET | Freq: Every day | ORAL | 0 refills | Status: DC

## 2020-11-04 ENCOUNTER — Telehealth: Payer: Self-pay | Admitting: Cardiology

## 2020-11-04 MED ORDER — AMLODIPINE BESYLATE 10 MG PO TABS: 10.0000 mg | ORAL_TABLET | Freq: Every day | ORAL | 0 refills | Status: DC

## 2020-11-04 NOTE — Telephone Encounter (Signed)
Pt's medication was sent to pt's pharmacy as requested. Confirmation received.  °

## 2020-11-04 NOTE — Telephone Encounter (Signed)
New message       *STAT* If patient is at the pharmacy, call can be transferred to refill team.   1. Which medications need to be refilled? (please list name of each medication and dose if known) amlopidine 10mg   2. Which pharmacy/location (including street and city if local pharmacy) is medication to be sent to?  [facility] on [address]  3. Do they need a 30 day or 90 day supply?  Pt is out of medication and have an appt scheduled for 11-09-20

## 2020-11-09 ENCOUNTER — Other Ambulatory Visit: Payer: Self-pay

## 2020-11-09 ENCOUNTER — Ambulatory Visit (INDEPENDENT_AMBULATORY_CARE_PROVIDER_SITE_OTHER): Payer: 59 | Admitting: Physician Assistant

## 2020-11-09 ENCOUNTER — Encounter: Payer: Self-pay | Admitting: Physician Assistant

## 2020-11-09 ENCOUNTER — Ambulatory Visit (INDEPENDENT_AMBULATORY_CARE_PROVIDER_SITE_OTHER): Payer: 59

## 2020-11-09 VITALS — BP 122/70 | HR 60 | Ht 72.0 in | Wt 260.8 lb

## 2020-11-09 DIAGNOSIS — E7800 Pure hypercholesterolemia, unspecified: Secondary | ICD-10-CM

## 2020-11-09 DIAGNOSIS — I517 Cardiomegaly: Secondary | ICD-10-CM

## 2020-11-09 DIAGNOSIS — I77810 Thoracic aortic ectasia: Secondary | ICD-10-CM

## 2020-11-09 DIAGNOSIS — I10 Essential (primary) hypertension: Secondary | ICD-10-CM

## 2020-11-09 DIAGNOSIS — I350 Nonrheumatic aortic (valve) stenosis: Secondary | ICD-10-CM

## 2020-11-09 DIAGNOSIS — I493 Ventricular premature depolarization: Secondary | ICD-10-CM

## 2020-11-09 DIAGNOSIS — I712 Thoracic aortic aneurysm, without rupture, unspecified: Secondary | ICD-10-CM

## 2020-11-09 NOTE — Progress Notes (Signed)
Cardiology Office Note:    Date:  11/09/2020   ID:  David Stephens, DOB 16-May-1955, MRN [id number]  PCP:  [facility] Providers Cardiologist:  David Sprague, MD    Referring MD: [facility]*   Chief Complaint:  F/u for Aortic Stenosis    Patient Profile:   David Stephens is a 65 y.o. male with:  Hypertension Severe LVH on echocardiogram Mild aortic stenosis Hyperlipidemia Kidney stones Chronic kidney disease Dilated ascending aorta Echo 9/21: 38 mm  Prior CV studies: Echocardiogram 09/24/2019 EF 60-65, no RWMA, severe LVH, normal RVSF, mild LAE, trivial MR, mild aortic stenosis (mean 8.6 mmHg, V-max 210 cm/s, DI 0.47), mild dilation of the ascending aorta (38 mm)  Myoview 03/07/2007 1. No areas of reversibility to suggest inducible ischemia.  2. Mild global hypokinesis.  3. Ejection fraction estimated at 47%.   History of Present Illness: David Stephens was evaluated by Dr. Shari Stephens in 9/21 for mild aortic stenosis.  Mean gradient on echocardiogram at that time was 8.6 mmHg.  She recommended repeat echocardiogram in 3 to 5 years.  He returns for follow-up.  He is here alone.  He has been doing well without chest discomfort, significant shortness of breath, orthopnea, leg edema or syncope.      Past Medical History:  Diagnosis Date   Anginal pain (HCC)    Arthritis    Hyperlipidemia    patient states he takes medication for this but ran out and has stopped it.   Inguinal hernia    Kidney stones    PONV (postoperative nausea and vomiting)    Umbilical hernia    Current Medications: Current Meds  Medication Sig   amLODipine (NORVASC) 10 MG tablet Take 1 tablet (10 mg total) by mouth daily. Pt must keep upcoming appt in October 2022 with Cardiologist before anymore refills. Thank you   atorvastatin (LIPITOR) 40 MG tablet Take 40 mg by mouth daily.   Cholecalciferol (VITAMIN D3) 10 MCG (400 UNIT) tablet  Take 400 Units by mouth daily.   losartan (COZAAR) 50 MG tablet Take 1 tablet (50 mg total) by mouth daily.   Multiple Vitamin (MULTIVITAMIN) tablet Take 1 tablet by mouth daily.    Allergies:   Clindamycin/lincomycin   Social History   Tobacco Use   Smoking status: Never   Smokeless tobacco: Never  Substance Use Topics   Alcohol use: No   Drug use: No    Family Hx: The patient's family history is not on file.  Review of Systems  Musculoskeletal:  Positive for back pain.  - See HPI  EKGs/Labs/Other Test Reviewed:    EKG:  EKG is   ordered today.  The ekg ordered today demonstrates NSR, HR 60, normal axis, multifocal PVCs, PACs, QTC 478  Recent Labs: No results found for requested labs within last 8760 hours.   Recent Lipid Panel No results found for: CHOL, TRIG, HDL, LDLCALC, LDLDIRECT  Labs obtained through [facility] Tool - personally reviewed and interpreted: 06/30/2020: Creatinine 1.52, K+ 4.6, ALT 23   Risk Assessment/Calculations:          Physical Exam:    VS:  BP 122/70   Pulse 60   Ht 6' (1.829 m)   Wt 260 lb 12.8 oz (118.3 kg)   SpO2 97%   BMI 35.37 kg/m     Wt Readings from Last 3 Encounters:  11/09/20 260 lb 12.8 oz (118.3 kg)  10/17/19  262 lb 6.4 oz (119 kg)  07/09/15 261 lb (118.4 kg)    Constitutional:      Appearance: Healthy appearance. Not in distress.  Neck:     Vascular: JVD normal.  Pulmonary:     Effort: Pulmonary effort is normal.     Breath sounds: No wheezing. No rales.  Cardiovascular:     Normal rate. Regular rhythm. Normal S1. Normal S2.      Murmurs: There is a grade 2/6 systolic murmur at the URSB.  Edema:    Peripheral edema absent.  Abdominal:     Palpations: Abdomen is soft.  Skin:    General: Skin is warm and dry.  Neurological:     General: No focal deficit present.     Mental Status: Alert and oriented to person, place and time.     Cranial Nerves: Cranial nerves are intact.       ASSESSMENT & PLAN:   1. Mild  aortic stenosis Mean gradient 8.6 on echocardiogram in 9/21.  He is not having any symptoms to suggest worsening aortic stenosis.  Plan follow-up echo in the next 2 to 3 years.  Follow-up in 1 year.  2. Ascending aorta dilatation (HCC) 38 mm on echocardiogram last year.  Arrange chest/aorta CTA.  3. Essential hypertension 4. LVH (left ventricular hypertrophy) Blood pressure is well controlled.  Continue losartan 50 mg daily, amlodipine 10 mg daily.  5. Pure hypercholesterolemia Managed by primary care.  LDL 76 in August 2021.  He remains on atorvastatin.  6. PVC's (premature ventricular contractions) Asymptomatic.  He had recent labs at work and will send [place] a copy (to check magnesium, potassium).  Arrange 3-day ZIO to assess PVC burden.         Dispo:  Return in about 1 year (around 11/09/2021) for Routine Follow Up, w/ Dr. Shari Stephens.   Medication Adjustments/Labs and Tests Ordered: Current medicines are reviewed at length with the patient today.  Concerns regarding medicines are outlined above.  Tests Ordered: Orders Placed This Encounter  Procedures   CT ANGIO CHEST AORTA W/CM & OR WO/CM   Basic Metabolic Panel (BMET)   LONG TERM MONITOR (3-14 DAYS)   EKG 12-Lead    Medication Changes: No orders of the defined types were placed in this encounter.  Signed, David Newcomer, PA-C  11/09/2020 3:56 PM    [facility] [address] [place], [place], [place]  [address] Phone: [phone]; Fax: [phone]

## 2020-11-09 NOTE — Patient Instructions (Addendum)
Medication Instructions:   Your physician recommends that you continue on your current medications as directed. Please refer to the Current Medication list given to you today.  *If you need a refill on your cardiac medications before your next appointment, please call your pharmacy*   Lab Work:  -None  If you have labs (blood work) drawn today and your tests are completely normal, you will receive your results only by: MyChart Message (if you have MyChart) OR A paper copy in the mail If you have any lab test that is abnormal or we need to change your treatment, we will call you to review the results.   Testing/Procedures: David Stephens- Long Term Monitor Instructions  Your physician has requested you wear a ZIO patch monitor for 3 days.  This is a single patch monitor. [employer] supplies one patch monitor per enrollment. Additional stickers are not available. Please do not apply patch if you will be having a Nuclear Stress Test,  Echocardiogram, Cardiac CT, MRI, or Chest Xray during the period you would be wearing the  monitor. The patch cannot be worn during these tests. You cannot remove and re-apply the  ZIO XT patch monitor.  Your ZIO patch monitor will be mailed 3 day USPS to your address on file. It may take 3-5 days  to receive your monitor after you have been enrolled.  Once you have received your monitor, please review the enclosed instructions. Your monitor  has already been registered assigning a specific monitor serial # to you.  Billing and Patient Assistance Program Information  We have supplied [employer] with any of your insurance information on file for billing purposes. [employer] offers a sliding scale Patient Assistance Program for patients that do not have  insurance, or whose insurance does not completely cover the cost of the ZIO monitor.  You must apply for the Patient Assistance Program to qualify for this discounted rate.  To apply, please call [employer] at [phone],  select option 4, select option 2, ask to apply for  Patient Assistance Program. Meredeth Ide will ask your household income, and how many people  are in your household. They will quote your out-of-pocket cost based on that information.  [employer] will also be able to set up a 83-month, interest-free payment plan if needed.  Applying the monitor   Shave hair from upper left chest.  Hold abrader disc by orange tab. Rub abrader in 40 strokes over the upper left chest as  indicated in your monitor instructions.  Clean area with 4 enclosed alcohol pads. Let dry.  Apply patch as indicated in monitor instructions. Patch will be placed under collarbone on left  side of chest with arrow pointing upward.  Rub patch adhesive wings for 2 minutes. Remove white label marked "1". Remove the white  label marked "2". Rub patch adhesive wings for 2 additional minutes.  While looking in a mirror, press and release button in center of patch. A small green light will  flash 3-4 times. This will be your only indicator that the monitor has been turned on.  Do not shower for the first 24 hours. You may shower after the first 24 hours.  Press the button if you feel a symptom. You will hear a small click. Record Date, Time and  Symptom in the Patient Logbook.  When you are ready to remove the patch, follow instructions on the last 2 pages of Patient  Logbook. Stick patch monitor onto the last page of Patient Logbook.  Place  Patient Logbook in the blue and white box. Use locking tab on box and tape box closed  securely. The blue and white box has prepaid postage on it. Please place it in the mailbox as  soon as possible. Your physician should have your test results approximately 7 days after the  monitor has been mailed back to [facility].  Call [facility] Customer Care at [phone] if you have questions regarding  your ZIO XT patch monitor. Call them immediately if you see an orange light blinking on your   monitor.  If your monitor falls off in less than 4 days, contact our Monitor department at [phone].  If your monitor becomes loose or falls off after 4 days call [employer] at [phone] for  suggestions on securing your monitor    Follow-Up: At [facility], you and your health needs are our priority.  As part of our continuing mission to provide you with exceptional heart care, we have created designated Provider Care Teams.  These Care Teams include your primary Cardiologist (physician) and Advanced Practice Providers (APPs -  Physician Assistants and Nurse Practitioners) who all work together to provide you with the care you need, when you need it.  We recommend signing up for the patient portal called "MyChart".  Sign up information is provided on this After Visit Summary.  MyChart is used to connect with patients for Virtual Visits (Telemedicine).  Patients are able to view lab/test results, encounter notes, upcoming appointments, etc.  Non-urgent messages can be sent to your provider as well.   To learn more about what you can do with MyChart, go to [email or web address].    Your next appointment:   12 month(s)  The format for your next appointment:   In Person  Provider:   Laurance Flatten, MD   Other Instructions Your physician wants you to follow-up in: 1 year with Dr. Shari Prows. You will receive a reminder letter in the mail two months in advance. If you don't receive a letter, please call our office to schedule the follow-up appointment.   You will send labs from your work.  Patient will call to schedule CT Aorta.

## 2020-11-09 NOTE — Progress Notes (Unsigned)
Patient enrolled for [employer] to mail a 3 day ZIO XT monitor to his address on file. 

## 2020-11-14 DIAGNOSIS — E7800 Pure hypercholesterolemia, unspecified: Secondary | ICD-10-CM | POA: Diagnosis not present

## 2020-11-14 DIAGNOSIS — I77810 Thoracic aortic ectasia: Secondary | ICD-10-CM

## 2020-11-14 DIAGNOSIS — I517 Cardiomegaly: Secondary | ICD-10-CM

## 2020-11-14 DIAGNOSIS — I350 Nonrheumatic aortic (valve) stenosis: Secondary | ICD-10-CM | POA: Diagnosis not present

## 2020-11-14 DIAGNOSIS — I10 Essential (primary) hypertension: Secondary | ICD-10-CM

## 2020-11-14 DIAGNOSIS — I493 Ventricular premature depolarization: Secondary | ICD-10-CM

## 2020-11-23 DIAGNOSIS — Z9289 Personal history of other medical treatment: Secondary | ICD-10-CM

## 2020-11-23 HISTORY — DX: Personal history of other medical treatment: Z92.89

## 2020-11-30 ENCOUNTER — Telehealth: Payer: Self-pay | Admitting: Cardiology

## 2020-11-30 MED ORDER — LOSARTAN POTASSIUM 50 MG PO TABS: 50.0000 mg | ORAL_TABLET | Freq: Every day | ORAL | 3 refills | Status: DC

## 2020-11-30 NOTE — Telephone Encounter (Signed)
Chart reviewed.   Rx(s) sent to pharmacy electronically.   Patient voiced understanding.

## 2020-11-30 NOTE — Telephone Encounter (Signed)
*  STAT* If patient is at the pharmacy, call can be transferred to refill team.   1. Which medications need to be refilled? (please list name of each medication and dose if known) losartan (COZAAR) 50 MG tablet  2. Which pharmacy/location (including street and city if local pharmacy) is medication to be sent to? [id number] - [place], [place] - [address]  3. Do they need a 30 day or 90 day supply? 30 ds

## 2020-12-02 ENCOUNTER — Encounter: Payer: Self-pay | Admitting: *Deleted

## 2020-12-06 ENCOUNTER — Ambulatory Visit
Admission: RE | Admit: 2020-12-06 | Discharge: 2020-12-06 | Disposition: A | Discharge status: Home / Self Care | Payer: 59 | Source: Ambulatory Visit | Attending: Physician Assistant | Admitting: Physician Assistant

## 2020-12-06 DIAGNOSIS — I712 Thoracic aortic aneurysm, without rupture, unspecified: Secondary | ICD-10-CM

## 2020-12-06 MED ORDER — IOPAMIDOL (ISOVUE-370) INJECTION 76%
75.0000 mL | Freq: Once | INTRAVENOUS | Status: AC | PRN
  Administered 2020-12-06: 75 mL via INTRAVENOUS

## 2020-12-07 ENCOUNTER — Encounter: Payer: Self-pay | Admitting: Physician Assistant

## 2021-05-05 ENCOUNTER — Other Ambulatory Visit: Payer: Self-pay | Admitting: *Deleted

## 2021-05-05 MED ORDER — AMLODIPINE BESYLATE 10 MG PO TABS: 10.0000 mg | ORAL_TABLET | Freq: Every day | ORAL | 3 refills | Status: DC

## 2021-11-30 ENCOUNTER — Other Ambulatory Visit: Payer: Self-pay

## 2021-11-30 MED ORDER — ATORVASTATIN CALCIUM 40 MG PO TABS: 40.0000 mg | ORAL_TABLET | Freq: Every day | ORAL | 0 refills | Status: DC

## 2021-12-02 ENCOUNTER — Other Ambulatory Visit: Payer: Self-pay

## 2021-12-02 MED ORDER — LOSARTAN POTASSIUM 50 MG PO TABS: 50.0000 mg | ORAL_TABLET | Freq: Every day | ORAL | 0 refills | Status: DC

## 2022-01-02 ENCOUNTER — Other Ambulatory Visit: Payer: Self-pay

## 2022-01-02 MED ORDER — LOSARTAN POTASSIUM 50 MG PO TABS: 50.0000 mg | ORAL_TABLET | Freq: Every day | ORAL | 1 refills | Status: DC

## 2022-02-20 NOTE — Progress Notes (Unsigned)
Cardiology Office Note:    Date:  02/22/2022   ID:  David Stephens, David Stephens 1955/10/14, MRN [id number]  PCP:  [facility] [place] Cardiologist:  Freada Bergeron, MD  [place] Electrophysiologist:  None   Referring MD: [facility]*    History of Present Illness:    David Stephens is a 67 y.o. male with a hx of hypertension with evidence of severe LVH on TTE, mild aortic stenosis, and hyperlipidemia who presents to clinic for follow-up.  Patient with history of mild AS on TTE in 2021 which showed EF 60-65, no RWMA, severe LVH, normal RVSF, mild LAE, trivial MR, mild aortic stenosis (mean 8.6 mmHg, V-max 210 cm/s, DI 0.47), mild dilation of the ascending aorta (38 mm). Was last seen by Richardson Dopp in 10/2020 where he was doing well. Remained active without anginal symptoms.  Today, the patient states that he has been feeling "off" for several weeks. Has been getting more fatigued, lightheadedness, and SOB with activity. Notably, he is in Afib today which is a new diagnosis for him. Denies palpitations, chest pain, orthopnea or PND. Systolic blood pressure controlled ranging 100-120s. Compliant with medications.  Past Medical History:  Diagnosis Date   Anginal pain ([place])    Arthritis    Chest/Aorta CTA 11/2020   CT 11/22: normal sized aorta; no sig coronary artery Ca2+   Hyperlipidemia    patient states he takes medication for this but ran out and has stopped it.   Inguinal hernia    Kidney stones    PONV (postoperative nausea and vomiting)    Umbilical hernia     Past Surgical History:  Procedure Laterality Date   BUNIONECTOMY     COLONOSCOPY     HERNIA REPAIR     KNEE SURGERY Bilateral    several   multiple tooth extraction     NM MYOVIEW LTD  February 2009   No reversal perfusion defect to suggest ischemia. No infarct. Mild global hypokinesis with EF 47%.   TOTAL KNEE ARTHROPLASTY Right 10/09/2014   TOTAL KNEE  ARTHROPLASTY Right 10/09/2014   Procedure: TOTAL KNEE ARTHROPLASTY;  Surgeon: Earlie Server, MD;  Location: [facility];  Service: Orthopedics;  Laterality: Right;    Current Medications: Current Meds  Medication Sig   amLODipine (NORVASC) 5 MG tablet Take 1 tablet (5 mg total) by mouth daily.   apixaban (ELIQUIS) 5 MG TABS tablet Take 1 tablet (5 mg total) by mouth 2 (two) times daily.   atorvastatin (LIPITOR) 40 MG tablet Take 1 tablet (40 mg total) by mouth daily.   Cholecalciferol (VITAMIN D3) 10 MCG (400 UNIT) tablet Take 400 Units by mouth daily.   metoprolol tartrate (LOPRESSOR) 25 MG tablet Take 1 tablet (25 mg total) by mouth 2 (two) times daily.   Multiple Vitamin (MULTIVITAMIN) tablet Take 1 tablet by mouth daily.   [DISCONTINUED] amLODipine (NORVASC) 10 MG tablet Take 1 tablet (10 mg total) by mouth daily.   [DISCONTINUED] losartan (COZAAR) 50 MG tablet Take 1 tablet (50 mg total) by mouth daily.     Allergies:   Clindamycin/lincomycin   Social History   Socioeconomic History   Marital status: Divorced    Spouse name: Not on file   Number of children: Not on file   Years of education: Not on file   Highest education level: Not on file  Occupational History   Not on file  Tobacco Use   Smoking status:  Never   Smokeless tobacco: Never  Substance and Sexual Activity   Alcohol use: No   Drug use: No   Sexual activity: Not on file  Other Topics Concern   Not on file  Social History Narrative   Not on file   Social Determinants of Health   Financial Resource Strain: Not on file  Food Insecurity: Not on file  Transportation Needs: Not on file  Physical Activity: Not on file  Stress: Not on file  Social Connections: Not on file     Family History: The patient's family history is not on file.  ROS:   Please see the history of present illness.    The patient denies chest pain, chest pressure, dyspnea at rest or with exertion, palpitations, PND, orthopnea, or leg  swelling. Denies cough, fever, chills. Denies nausea, vomiting. Denies syncope or presyncope. Denies dizziness or lightheadedness. Denies snoring.  EKGs/Labs/Other Studies Reviewed:    The following studies were reviewed today: Cardiac Monitor 11/2020: Sinus rhythm with average heart rate of 60 bpm Frequent premature atrial contractions, PAC"s (6%) - no symptoms reported Rare premature ventricular contractions, PVC's Rare atrial tachycardia (longest 20 beats) - benign No atrial fibrillation, no pauses, no VT  TTE 09/24/19: 1. Left ventricular ejection fraction, by estimation, is 60 to 65%. The  left ventricle has normal function. The left ventricle has no regional  wall motion abnormalities. There is severe left ventricular hypertrophy.  Left ventricular diastolic parameters   were normal.   2. Right ventricular systolic function is normal. The right ventricular  size is normal. There is normal pulmonary artery systolic pressure.   3. Left atrial size was mildly dilated.   4. The mitral valve is normal in structure. Trivial mitral valve  regurgitation. No evidence of mitral stenosis.   5. The aortic valve is tricuspid. Aortic valve regurgitation is trivial.  Mild aortic valve stenosis.   6. Aortic dilatation noted. There is mild dilatation of the ascending  aorta measuring 38 mm.   7. The inferior vena cava is normal in size with greater than 50%  respiratory variability, suggesting right atrial pressure of 3 mmHg.   TTE 09/2014: - Left ventricle: The cavity size was normal. Wall thickness was    increased in a pattern of moderate LVH. Systolic function was    normal. The estimated ejection fraction was in the range of 60%    to 65%.  - Aortic valve: There was trivial regurgitation.  - Mitral valve: There was mild regurgitation.  - Left atrium: The atrium was mildly to moderately dilated.  Nuclear stress 2009: IMPRESSION:  1. No areas of reversibility to suggest inducible  ischemia.  2. Mild global hypokinesis.  3. Ejection fraction estimated at 47%.   EKG:  EKG is ordered today.  The ekg ordered today demonstrates normal sinus rhythm with heart rate 66. Occasional PACs. Some early repol.  Recent Labs: No results found for requested labs within last 365 days.  Recent Lipid Panel No results found for: "CHOL", "TRIG", "HDL", "CHOLHDL", "VLDL", "LDLCALC", "LDLDIRECT"  Physical Exam:    VS:  BP 120/82   Pulse (!) 101   Ht 6' (1.829 m)   Wt 278 lb 6.4 oz (126.3 kg)   SpO2 96%   BMI 37.76 kg/m     Wt Readings from Last 3 Encounters:  02/22/22 278 lb 6.4 oz (126.3 kg)  11/09/20 260 lb 12.8 oz (118.3 kg)  10/17/19 262 lb 6.4 oz (119 kg)  GEN:  Well nourished, well developed in no acute distress HEENT: Normal NECK: No JVD; No carotid bruits LYMPHATICS: No lymphadenopathy CARDIAC: RRR, 2+ systolic murmur best heard at RUSB RESPIRATORY:  Clear to auscultation without rales, wheezing or rhonchi  ABDOMEN: Soft, non-tender, non-distended MUSCULOSKELETAL:  No edema; No deformity  SKIN: Warm and dry NEUROLOGIC:  Alert and oriented x 3 PSYCHIATRIC:  Normal affect   ASSESSMENT:    1. New onset a-fib (HCC)   2. Mild aortic stenosis   3. Ascending aorta dilatation (HCC)   4. Thoracic aortic aneurysm without rupture, unspecified part (HCC)   5. LVH (left ventricular hypertrophy)   6. Essential hypertension   7. Pure hypercholesterolemia   8. PVC's (premature ventricular contractions)     PLAN:    In order of problems listed above:  #Newly Diagnosed Afib: CHADs-vasc 2. Newly diagnosed on ECG today with HR 101. Has been having episodes of lightheadedness, fatigue and decreased exercise tolerance. Will start apixaban, metop and arrange for Afib clinic follow-up. If remains out of rhythm, plan for DCCV at that time. -Start apixaban 5mg  BID -Start metop 25mg  BID -Refer to Afib clinic and if remains out of rhythm, will arrange for DCCV at that  time  #Mild Aortic Stenosis TTE in 2021 showed mean gradient measures 8.6 mmHg. Aortic valve area, by VTI measures 1.80 cm.   -Repeat TTE for monitoring  #Hypertension with severe LVH on TTE: BP well controlled today. -Decrease amlodipine to 5mg  daily to allow BP room for metop -Continue losartan 50mg  daily -Start metop 25mg  BID as above  #Hyperlipidemia -Continue atorvastatin 40mg  -Lipids managed by PCP  #PVCs: Rare on cardiac monitor 11/2020. Currently doing well with no significant palpitations.  #CKD IIIB: -Labs followed by PCP  Medication Adjustments/Labs and Tests Ordered: Current medicines are reviewed at length with the patient today.  Concerns regarding medicines are outlined above.  Orders Placed This Encounter  Procedures   Amb Referral to AFIB Clinic   EKG 12-Lead   ECHOCARDIOGRAM COMPLETE   Meds ordered this encounter  Medications   losartan (COZAAR) 50 MG tablet    Sig: Take 1 tablet (50 mg total) by mouth daily.    Dispense:  90 tablet    Refill:  3   apixaban (ELIQUIS) 5 MG TABS tablet    Sig: Take 1 tablet (5 mg total) by mouth 2 (two) times daily.    Dispense:  60 tablet    Refill:  6   metoprolol tartrate (LOPRESSOR) 25 MG tablet    Sig: Take 1 tablet (25 mg total) by mouth 2 (two) times daily.    Dispense:  180 tablet    Refill:  2   amLODipine (NORVASC) 5 MG tablet    Sig: Take 1 tablet (5 mg total) by mouth daily.    Dispense:  90 tablet    Refill:  2    Patient Instructions  Medication Instructions:   START TAKING ELIQUIS 5 MG BY MOUTH TWICE DAILY--PLEASE DO NOT SKIP ANY DOSES OF THIS MEDICATION  START TAKING METOPROLOL TARTRATE 25 MG BY MOUTH TWICE DAILY  DECREASE YOUR AMLODIPINE 5 MG BY MOUTH DAILY   *If you need a refill on your cardiac medications before your next appointment, please call your pharmacy*   You have been referred to AFIB CLINIC TO BE SEEN IN 2 WEEKS FOR NEW ONSET AFIB--IF STILL IN AFIB AT THAT VISIT, ARRANGE FOR  CARDIOVERSION AT THAT TIME    Testing/Procedures:  Your physician has  requested that you have an echocardiogram. Echocardiography is a painless test that uses sound waves to create images of your heart. It provides your doctor with information about the size and shape of your heart and how well your heart's chambers and valves are working. This procedure takes approximately one hour. There are no restrictions for this procedure. Please do NOT wear cologne, perfume, aftershave, or lotions (deodorant is allowed). Please arrive 15 minutes prior to your appointment time.    Follow-Up:  1.)  2 WEEKS WITH AFIB CLINIC--IF STILL IN AFIB AT THAT TIME, ARRANGE FOR CARDIOVERSION  2.)  3 MONTHS WITH DR. Shari Prows     Signed, Meriam Sprague, MD  02/22/2022 3:58 PM    [facility]

## 2022-02-22 ENCOUNTER — Encounter: Payer: Self-pay | Admitting: Cardiology

## 2022-02-22 ENCOUNTER — Ambulatory Visit: Payer: 59 | Attending: Cardiology | Admitting: Cardiology

## 2022-02-22 VITALS — BP 120/82 | HR 101 | Ht 72.0 in | Wt 278.4 lb

## 2022-02-22 DIAGNOSIS — E7800 Pure hypercholesterolemia, unspecified: Secondary | ICD-10-CM

## 2022-02-22 DIAGNOSIS — I712 Thoracic aortic aneurysm, without rupture, unspecified: Secondary | ICD-10-CM

## 2022-02-22 DIAGNOSIS — I517 Cardiomegaly: Secondary | ICD-10-CM

## 2022-02-22 DIAGNOSIS — I77810 Thoracic aortic ectasia: Secondary | ICD-10-CM

## 2022-02-22 DIAGNOSIS — I350 Nonrheumatic aortic (valve) stenosis: Secondary | ICD-10-CM | POA: Diagnosis not present

## 2022-02-22 DIAGNOSIS — I10 Essential (primary) hypertension: Secondary | ICD-10-CM

## 2022-02-22 DIAGNOSIS — I493 Ventricular premature depolarization: Secondary | ICD-10-CM

## 2022-02-22 DIAGNOSIS — I4891 Unspecified atrial fibrillation: Secondary | ICD-10-CM | POA: Diagnosis not present

## 2022-02-22 MED ORDER — AMLODIPINE BESYLATE 5 MG PO TABS: 5.0000 mg | ORAL_TABLET | Freq: Every day | ORAL | 2 refills | Status: DC

## 2022-02-22 MED ORDER — LOSARTAN POTASSIUM 50 MG PO TABS: 50.0000 mg | ORAL_TABLET | Freq: Every day | ORAL | 3 refills | Status: DC

## 2022-02-22 MED ORDER — APIXABAN 5 MG PO TABS: 5.0000 mg | ORAL_TABLET | Freq: Two times a day (BID) | ORAL | 6 refills | Status: DC

## 2022-02-22 MED ORDER — METOPROLOL TARTRATE 25 MG PO TABS: 25.0000 mg | ORAL_TABLET | Freq: Two times a day (BID) | ORAL | 2 refills | Status: DC

## 2022-02-22 NOTE — Patient Instructions (Signed)
Medication Instructions:   START TAKING ELIQUIS 5 MG BY MOUTH TWICE DAILY--PLEASE DO NOT SKIP ANY DOSES OF THIS MEDICATION  START TAKING METOPROLOL TARTRATE 25 MG BY MOUTH TWICE DAILY  DECREASE YOUR AMLODIPINE 5 MG BY MOUTH DAILY   *If you need a refill on your cardiac medications before your next appointment, please call your pharmacy*   You have been referred to [place] [facility] [place] AFIB--IF STILL IN AFIB AT THAT VISIT, ARRANGE FOR CARDIOVERSION AT THAT TIME    Testing/Procedures:  Your physician has requested that you have an echocardiogram. Echocardiography is a painless test that uses sound waves to create images of your heart. It provides your doctor with information about the size and shape of your heart and how well your heart's chambers and valves are working. This procedure takes approximately one hour. There are no restrictions for this procedure. Please do NOT wear cologne, perfume, aftershave, or lotions (deodorant is allowed). Please arrive 15 minutes prior to your appointment time.    Follow-Up:  1.)  2 WEEKS WITH AFIB CLINIC--IF STILL IN AFIB AT THAT TIME, ARRANGE FOR CARDIOVERSION  2.)  3 MONTHS WITH DR. Johney Frame

## 2022-03-08 ENCOUNTER — Ambulatory Visit (HOSPITAL_COMMUNITY)
Admission: RE | Admit: 2022-03-08 | Discharge: 2022-03-08 | Disposition: A | Discharge status: Home / Self Care | Payer: 59 | Source: Ambulatory Visit | Attending: Physician Assistant | Admitting: Physician Assistant

## 2022-03-08 ENCOUNTER — Encounter (HOSPITAL_COMMUNITY): Payer: Self-pay | Admitting: Physician Assistant

## 2022-03-08 VITALS — BP 134/84 | HR 125 | Ht 72.0 in | Wt 275.4 lb

## 2022-03-08 DIAGNOSIS — Z7901 Long term (current) use of anticoagulants: Secondary | ICD-10-CM | POA: Diagnosis not present

## 2022-03-08 DIAGNOSIS — I77810 Thoracic aortic ectasia: Secondary | ICD-10-CM

## 2022-03-08 DIAGNOSIS — Z7182 Exercise counseling: Secondary | ICD-10-CM | POA: Insufficient documentation

## 2022-03-08 DIAGNOSIS — I10 Essential (primary) hypertension: Secondary | ICD-10-CM | POA: Insufficient documentation

## 2022-03-08 DIAGNOSIS — I4819 Other persistent atrial fibrillation: Secondary | ICD-10-CM

## 2022-03-08 DIAGNOSIS — D6869 Other thrombophilia: Secondary | ICD-10-CM | POA: Diagnosis not present

## 2022-03-08 DIAGNOSIS — E785 Hyperlipidemia, unspecified: Secondary | ICD-10-CM | POA: Insufficient documentation

## 2022-03-08 DIAGNOSIS — Z6837 Body mass index (BMI) 37.0-37.9, adult: Secondary | ICD-10-CM | POA: Insufficient documentation

## 2022-03-08 DIAGNOSIS — Z79899 Other long term (current) drug therapy: Secondary | ICD-10-CM | POA: Diagnosis not present

## 2022-03-08 DIAGNOSIS — I350 Nonrheumatic aortic (valve) stenosis: Secondary | ICD-10-CM

## 2022-03-08 DIAGNOSIS — Z006 Encounter for examination for normal comparison and control in clinical research program: Secondary | ICD-10-CM

## 2022-03-08 DIAGNOSIS — E669 Obesity, unspecified: Secondary | ICD-10-CM | POA: Insufficient documentation

## 2022-03-08 DIAGNOSIS — I712 Thoracic aortic aneurysm, without rupture, unspecified: Secondary | ICD-10-CM

## 2022-03-08 MED ORDER — METOPROLOL TARTRATE 25 MG PO TABS: 50.0000 mg | ORAL_TABLET | Freq: Two times a day (BID) | ORAL | 2 refills | Status: DC

## 2022-03-08 NOTE — Research (Signed)
[facility] Informed Consent   Subject Name: David Stephens  Subject met inclusion and exclusion criteria.  The informed consent form, study requirements and expectations were reviewed with the subject and questions and concerns were addressed prior to the signing of the consent form.  The subject verbalized understanding of the trial requirements.  The subject agreed to participate in the [facility] trial and signed the informed consent on 14/Feb/2024.  The informed consent was obtained prior to performance of any protocol-specific procedures for the subject.  A copy of the signed informed consent was given to the subject and a copy was placed in the subject's medical record.   Tori Milks Kenyada Dosch

## 2022-03-08 NOTE — Progress Notes (Signed)
Primary Care Physician: [facility] Primary Cardiologist: Dr Johney Frame Primary Electrophysiologist: none Referring Physician: Dr Marice Potter is a 67 y.o. male with a history of HTN, severe LVH, mild AS, HLD, atrial fibrillation who presents for consultation in the [facility].  The patient was initially diagnosed with atrial fibrillation 02/22/22 after presenting to Dr Jacolyn Reedy office with symptoms of fatigue and intermittent lightheadedness. ECG showed new onset afib. He was started on Eliquis for a CHADS2VASC score of 2 and metoprolol for rate control. Patient remains in afib today with no change in his symptoms. He denies alcohol use but does admit to snoring and daytime somnolence.    Today, he denies symptoms of palpitations, chest pain, orthopnea, PND, lower extremity edema, presyncope, syncope, bleeding, or neurologic sequela. The patient is tolerating medications without difficulties and is otherwise without complaint today.    Atrial Fibrillation Risk Factors:  he does have symptoms or diagnosis of sleep apnea. he is not ready to do a sleep study at this time.  he does not have a history of rheumatic fever. he does not have a history of alcohol use. The patient does not have a history of early familial atrial fibrillation or other arrhythmias.  he has a BMI of Body mass index is 37.35 kg/m.Marland Kitchen Filed Weights   03/08/22 1519  Weight: 124.9 kg    No family history on file.   Atrial Fibrillation Management history:  Previous antiarrhythmic drugs: none Previous cardioversions: none Previous ablations: none CHADS2VASC score: 2 Anticoagulation history: Eliquis   Past Medical History:  Diagnosis Date   Anginal pain ([place])    Arthritis    Chest/Aorta CTA 11/2020   CT 11/22: normal sized aorta; no sig coronary artery Ca2+   Hyperlipidemia    patient states he takes medication for this but ran  out and has stopped it.   Inguinal hernia    Kidney stones    PONV (postoperative nausea and vomiting)    Umbilical hernia    Past Surgical History:  Procedure Laterality Date   BUNIONECTOMY     COLONOSCOPY     HERNIA REPAIR     KNEE SURGERY Bilateral    several   multiple tooth extraction     NM MYOVIEW LTD  February 2009   No reversal perfusion defect to suggest ischemia. No infarct. Mild global hypokinesis with EF 47%.   TOTAL KNEE ARTHROPLASTY Right 10/09/2014   TOTAL KNEE ARTHROPLASTY Right 10/09/2014   Procedure: TOTAL KNEE ARTHROPLASTY;  Surgeon: Earlie Server, MD;  Location: [facility];  Service: Orthopedics;  Laterality: Right;    Current Outpatient Medications  Medication Sig Dispense Refill   amLODipine (NORVASC) 5 MG tablet Take 1 tablet (5 mg total) by mouth daily. 90 tablet 2   apixaban (ELIQUIS) 5 MG TABS tablet Take 1 tablet (5 mg total) by mouth 2 (two) times daily. 60 tablet 6   atorvastatin (LIPITOR) 40 MG tablet Take 1 tablet (40 mg total) by mouth daily. 30 tablet 0   Cholecalciferol (VITAMIN D3) 10 MCG (400 UNIT) tablet Take 400 Units by mouth daily.     losartan (COZAAR) 50 MG tablet Take 1 tablet (50 mg total) by mouth daily. 90 tablet 3   metoprolol tartrate (LOPRESSOR) 25 MG tablet Take 1 tablet (25 mg total) by mouth 2 (two) times daily. 180 tablet 2   Multiple Vitamin (MULTIVITAMIN) tablet Take 1 tablet by mouth daily.  No current facility-administered medications for this encounter.    Allergies  Allergen Reactions   Clindamycin/Lincomycin Diarrhea    Social History   Socioeconomic History   Marital status: Divorced    Spouse name: Not on file   Number of children: Not on file   Years of education: Not on file   Highest education level: Not on file  Occupational History   Not on file  Tobacco Use   Smoking status: Never   Smokeless tobacco: Never   Tobacco comments:    Never smoke 03/08/22   Substance and Sexual Activity   Alcohol use:  No   Drug use: No   Sexual activity: Not on file  Other Topics Concern   Not on file  Social History Narrative   Not on file   Social Determinants of Health   Financial Resource Strain: Not on file  Food Insecurity: Not on file  Transportation Needs: Not on file  Physical Activity: Not on file  Stress: Not on file  Social Connections: Not on file  Intimate Partner Violence: Not on file     ROS- All systems are reviewed and negative except as per the HPI above.  Physical Exam: Vitals:   03/08/22 1519  BP: 134/84  Pulse: (!) 125  Weight: 124.9 kg  Height: 6' (1.829 m)    GEN- The patient is a well appearing obese male, alert and oriented x 3 today.   Head- normocephalic, atraumatic Eyes-  Sclera clear, conjunctiva pink Ears- hearing intact Oropharynx- clear Neck- supple  Lungs- Clear to ausculation bilaterally, normal work of breathing Heart- irregular rate and rhythm, no murmurs, rubs or gallops  GI- soft, NT, ND, + BS Extremities- no clubbing, cyanosis, or edema MS- no significant deformity or atrophy Skin- no rash or lesion Psych- euthymic mood, full affect Neuro- strength and sensation are intact  Wt Readings from Last 3 Encounters:  03/08/22 124.9 kg  02/22/22 126.3 kg  11/09/20 118.3 kg    EKG today demonstrates  Coarse afib with RVR Vent. rate 125 BPM PR interval * ms QRS duration 80 ms QT/QTcB 326/470 ms  Echo 09/24/19 demonstrated   1. Left ventricular ejection fraction, by estimation, is 60 to 65%. The  left ventricle has normal function. The left ventricle has no regional  wall motion abnormalities. There is severe left ventricular hypertrophy.  Left ventricular diastolic parameters were normal.   2. Right ventricular systolic function is normal. The right ventricular  size is normal. There is normal pulmonary artery systolic pressure.   3. Left atrial size was mildly dilated.   4. The mitral valve is normal in structure. Trivial mitral valve   regurgitation. No evidence of mitral stenosis.   5. The aortic valve is tricuspid. Aortic valve regurgitation is trivial.  Mild aortic valve stenosis.   6. Aortic dilatation noted. There is mild dilatation of the ascending  aorta measuring 38 mm.   7. The inferior vena cava is normal in size with greater than 50%  respiratory variability, suggesting right atrial pressure of 3 mmHg.   Epic records are reviewed at length today  CHA2DS2-VASc Score = 2  The patient's score is based upon: CHF History: 0 HTN History: 0 Diabetes History: 0 Stroke History: 0 Vascular Disease History: 1 Age Score: 1 Gender Score: 0       ASSESSMENT AND PLAN: 1. Persistent Atrial Fibrillation (ICD10:  I48.19) The patient's CHA2DS2-VASc score is 2, indicating a 2.2% annual risk of stroke.   Patient  remains in persistent afib with elevated rates. We discussed rhythm control options today. Will plan for DCCV after 3 weeks of uninterrupted anticoagulation.  Continue Eliquis 5 mg BID (started 02/23/22) Increase Lopressor to 50 mg BID Repeat echo scheduled for 03/17/22  2. Secondary Hypercoagulable State (ICD10:  D68.69) The patient is at significant risk for stroke/thromboembolism based upon his CHA2DS2-VASc Score of 2.  Continue Apixaban (Eliquis).   3. Obesity Body mass index is 37.35 kg/m. Lifestyle modification was discussed at length including regular exercise and weight reduction.  4. Snoring/daytime somnolence  The importance of adequate treatment of sleep apnea was discussed today in order to improve our ability to maintain sinus rhythm long term. He has deferred sleep study for now.  5. HTN Stable, no changes today.   Follow up in the AF clinic post DCCV.    [facility] [address] [place], [place] [address] [phone] 03/08/2022 3:38 PM

## 2022-03-08 NOTE — Patient Instructions (Signed)
Increase metoprolol to 78m twice a day (2 of your 232mtwice a day)  Cardioversion scheduled for: Tuesday, March 5th   - Arrive at the [facility] go to admitting at 730am   - Do not eat or drink anything after midnight the night prior to your procedure.   - Take all your morning medication (except diabetic medications) with a sip of water prior to arrival.  - You will not be able to drive home after your procedure.    - Do NOT miss any doses of your blood thinner - if you should miss a dose please notify our office immediately.   - If you feel as if you go back into normal rhythm prior to scheduled cardioversion, please notify our office immediately.   If your procedure is canceled in the cardioversion suite you will be charged a cancellation fee.

## 2022-03-17 ENCOUNTER — Ambulatory Visit (HOSPITAL_COMMUNITY): Payer: 59 | Attending: Cardiology

## 2022-03-17 DIAGNOSIS — I77810 Thoracic aortic ectasia: Secondary | ICD-10-CM | POA: Insufficient documentation

## 2022-03-17 DIAGNOSIS — I350 Nonrheumatic aortic (valve) stenosis: Secondary | ICD-10-CM | POA: Diagnosis not present

## 2022-03-17 DIAGNOSIS — I712 Thoracic aortic aneurysm, without rupture, unspecified: Secondary | ICD-10-CM | POA: Diagnosis not present

## 2022-03-17 DIAGNOSIS — I4891 Unspecified atrial fibrillation: Secondary | ICD-10-CM | POA: Insufficient documentation

## 2022-03-17 LAB — ECHOCARDIOGRAM COMPLETE
Est EF: 45
MV M vel: 4.38 m/s
MV Peak grad: 76.7 mmHg
S' Lateral: 3.8 cm

## 2022-03-23 ENCOUNTER — Other Ambulatory Visit (HOSPITAL_COMMUNITY): Payer: 59 | Admitting: Physician Assistant

## 2022-03-23 ENCOUNTER — Telehealth (HOSPITAL_COMMUNITY): Payer: Self-pay | Admitting: *Deleted

## 2022-03-28 ENCOUNTER — Ambulatory Visit (HOSPITAL_COMMUNITY): Payer: 59 | Admitting: Certified Registered Nurse Anesthetist

## 2022-03-28 ENCOUNTER — Ambulatory Visit (HOSPITAL_BASED_OUTPATIENT_CLINIC_OR_DEPARTMENT_OTHER): Payer: 59 | Admitting: Certified Registered Nurse Anesthetist

## 2022-03-28 ENCOUNTER — Ambulatory Visit (HOSPITAL_COMMUNITY)
Admission: RE | Admit: 2022-03-28 | Discharge: 2022-03-28 | Disposition: A | Discharge status: Home / Self Care | Payer: 59 | Source: Ambulatory Visit | Attending: Cardiology | Admitting: Cardiology

## 2022-03-28 ENCOUNTER — Encounter (HOSPITAL_COMMUNITY)
Admission: RE | Disposition: A | Discharge status: Home / Self Care | Payer: Self-pay | Source: Ambulatory Visit | Attending: Cardiology

## 2022-03-28 ENCOUNTER — Encounter (HOSPITAL_COMMUNITY): Payer: Self-pay | Admitting: Cardiology

## 2022-03-28 DIAGNOSIS — Z7901 Long term (current) use of anticoagulants: Secondary | ICD-10-CM | POA: Insufficient documentation

## 2022-03-28 DIAGNOSIS — I119 Hypertensive heart disease without heart failure: Secondary | ICD-10-CM | POA: Insufficient documentation

## 2022-03-28 DIAGNOSIS — N189 Chronic kidney disease, unspecified: Secondary | ICD-10-CM

## 2022-03-28 DIAGNOSIS — I4819 Other persistent atrial fibrillation: Secondary | ICD-10-CM | POA: Diagnosis not present

## 2022-03-28 DIAGNOSIS — I350 Nonrheumatic aortic (valve) stenosis: Secondary | ICD-10-CM | POA: Insufficient documentation

## 2022-03-28 DIAGNOSIS — I4891 Unspecified atrial fibrillation: Secondary | ICD-10-CM

## 2022-03-28 DIAGNOSIS — I129 Hypertensive chronic kidney disease with stage 1 through stage 4 chronic kidney disease, or unspecified chronic kidney disease: Secondary | ICD-10-CM | POA: Diagnosis not present

## 2022-03-28 DIAGNOSIS — Z6837 Body mass index (BMI) 37.0-37.9, adult: Secondary | ICD-10-CM | POA: Insufficient documentation

## 2022-03-28 DIAGNOSIS — Z79899 Other long term (current) drug therapy: Secondary | ICD-10-CM | POA: Diagnosis not present

## 2022-03-28 DIAGNOSIS — M1990 Unspecified osteoarthritis, unspecified site: Secondary | ICD-10-CM | POA: Diagnosis not present

## 2022-03-28 DIAGNOSIS — E669 Obesity, unspecified: Secondary | ICD-10-CM | POA: Insufficient documentation

## 2022-03-28 DIAGNOSIS — E785 Hyperlipidemia, unspecified: Secondary | ICD-10-CM | POA: Diagnosis not present

## 2022-03-28 DIAGNOSIS — R0683 Snoring: Secondary | ICD-10-CM | POA: Diagnosis not present

## 2022-03-28 DIAGNOSIS — D6869 Other thrombophilia: Secondary | ICD-10-CM | POA: Insufficient documentation

## 2022-03-28 DIAGNOSIS — R400 Somnolence: Secondary | ICD-10-CM | POA: Diagnosis not present

## 2022-03-28 HISTORY — PX: CARDIOVERSION: SHX1299

## 2022-03-28 LAB — POCT I-STAT, CHEM 8
BUN: 25 mg/dL — ABNORMAL HIGH (ref 8–23)
Calcium, Ion: 1.21 mmol/L (ref 1.15–1.40)
Chloride: 110 mmol/L (ref 98–111)
Creatinine, Ser: 1.9 mg/dL — ABNORMAL HIGH (ref 0.61–1.24)
Glucose, Bld: 94 mg/dL (ref 70–99)
HCT: 43 % (ref 39.0–52.0)
Hemoglobin: 14.6 g/dL (ref 13.0–17.0)
Potassium: 3.8 mmol/L (ref 3.5–5.1)
Sodium: 143 mmol/L (ref 135–145)
TCO2: 21 mmol/L — ABNORMAL LOW (ref 22–32)

## 2022-03-28 SURGERY — CARDIOVERSION
Anesthesia: General

## 2022-03-28 MED ORDER — PROPOFOL 10 MG/ML IV BOLUS
INTRAVENOUS | Status: DC | PRN
  Administered 2022-03-28: 80 mg via INTRAVENOUS

## 2022-03-28 MED ORDER — LIDOCAINE 2% (20 MG/ML) 5 ML SYRINGE
INTRAMUSCULAR | Status: DC | PRN
  Administered 2022-03-28: 60 mg via INTRAVENOUS

## 2022-03-28 MED ORDER — SODIUM CHLORIDE 0.9 % IV SOLN: INTRAVENOUS | Status: DC

## 2022-03-28 NOTE — H&P (Signed)
ATRIAL FIB NEW PATIENT 03/08/2022 [facility]    [place], [place], [place] [facility] Persistent atrial fibrillation [facility]) +4 more Dx Referred by [facility] [place] Reason for Visit   Additional Documentation  Vitals: BP 134/84   Pulse 125 Important    Ht 6' (1.829 m)   Wt 124.9 kg   BMI 37.35 kg/m   BSA 2.52 m  Flowsheets: NEWS,   MEWS Score,   Anthropometrics,   Method of Visit  Encounter Info: Billing Info,   History,   Allergies,   Detailed Report   All Notes   Research signed by Default, Provider, MD at 03/09/2022 7:27 AM  Author: Default, Provider, MD Author Type: Physician Filed: 03/09/2022  7:27 AM  Completion Status: Authenticated Availability: Available Document ID: [id number]  Note Status: Signed Date of Service: 03/08/2022 11:59 PM    Editor: Default, Provider, MD (Physician)        Transcription Details: Research  Dictated By: Golden Pop, Provider, MD Dictated Date: 03/09/2022 Dictated Time:  7:23 AM  Transcribed By: Not found Transcribed Date: Not found Transcribed Time: Not found  Authorized By: Golden Pop, Provider, MD Authorization Date: 03/09/2022 Authorization Time:  7:27 AM          Scan on 03/09/2022 7:27 AM by Default, Provider, MD     Progress Notes by Oliver Barre, PA at 03/08/2022 3:30 PM  Author: Oliver Barre, PA Author Type: Physician Assistant Filed: 03/08/2022  4:26 PM  Note Status: Signed Cosign: Cosign Not Required Date of Service: 03/08/2022  3:30 PM  Editor: Oliver Barre, PA (Physician Assistant)                  Primary Care Physician: [facility] @ [place] Primary Cardiologist: Dr Johney Frame Primary Electrophysiologist: none Referring Physician: Dr Marice Potter is a 67 y.o. male with a history of HTN, severe LVH, mild AS, HLD, atrial fibrillation who presents for consultation in the [facility].  The patient was initially diagnosed with atrial fibrillation 02/22/22 after presenting to Dr Jacolyn Reedy office with symptoms of fatigue and intermittent lightheadedness. ECG showed new onset afib. He was started on Eliquis for a CHADS2VASC score of 2 and metoprolol for rate control. Patient remains in afib today with no change in his symptoms. He denies alcohol use but does admit to snoring and daytime somnolence.     Today, he denies symptoms of palpitations, chest pain, orthopnea, PND, lower extremity edema, presyncope, syncope, bleeding, or neurologic sequela. The patient is tolerating medications without difficulties and is otherwise without complaint today.      Atrial Fibrillation Risk Factors:   he does have symptoms or diagnosis of sleep apnea. he is not ready to do a sleep study at this time.  he does not have a history of rheumatic fever. he does not have a history of alcohol use. The patient does not have a history of early familial atrial fibrillation or other arrhythmias.   he has a BMI of Body mass index is 37.35 kg/m.Marland Kitchen    Filed Weights    03/08/22 1519  Weight: 124.9 kg      No family history on file.     Atrial Fibrillation Management history:   Previous antiarrhythmic drugs: none Previous cardioversions: none Previous ablations: none CHADS2VASC score: 2 Anticoagulation history: Eliquis         Past Medical History:  Diagnosis Date   Anginal pain ([place])     Arthritis     Chest/Aorta CTA 11/2020    CT 11/22: normal sized aorta; no sig coronary artery Ca2+   Hyperlipidemia      patient states he takes medication for this but ran out and has stopped it.   Inguinal hernia     Kidney stones     PONV (postoperative nausea and vomiting)     Umbilical hernia           Past Surgical History:  Procedure Laterality Date   BUNIONECTOMY       COLONOSCOPY       HERNIA REPAIR       KNEE SURGERY Bilateral      several   multiple tooth  extraction       NM MYOVIEW LTD   February 2009    No reversal perfusion defect to suggest ischemia. No infarct. Mild global hypokinesis with EF 47%.   TOTAL KNEE ARTHROPLASTY Right 10/09/2014   TOTAL KNEE ARTHROPLASTY Right 10/09/2014    Procedure: TOTAL KNEE ARTHROPLASTY;  Surgeon: Earlie Server, MD;  Location: [facility];  Service: Orthopedics;  Laterality: Right;            Current Outpatient Medications  Medication Sig Dispense Refill   amLODipine (NORVASC) 5 MG tablet Take 1 tablet (5 mg total) by mouth daily. 90 tablet 2   apixaban (ELIQUIS) 5 MG TABS tablet Take 1 tablet (5 mg total) by mouth 2 (two) times daily. 60 tablet 6   atorvastatin (LIPITOR) 40 MG tablet Take 1 tablet (40 mg total) by mouth daily. 30 tablet 0   Cholecalciferol (VITAMIN D3) 10 MCG (400 UNIT) tablet Take 400 Units by mouth daily.       losartan (COZAAR) 50 MG tablet Take 1 tablet (50 mg total) by mouth daily. 90 tablet 3   metoprolol tartrate (LOPRESSOR) 25 MG tablet Take 1 tablet (25 mg total) by mouth 2 (two) times daily. 180 tablet 2   Multiple Vitamin (MULTIVITAMIN) tablet Take 1 tablet by mouth daily.        No current facility-administered medications for this encounter.          Allergies  Allergen Reactions   Clindamycin/Lincomycin Diarrhea      Social History         Socioeconomic History   Marital status: Divorced      Spouse name: Not on file   Number of children: Not on file   Years of education: Not on file   Highest education level: Not on file  Occupational History   Not on file  Tobacco Use   Smoking status: Never   Smokeless tobacco: Never   Tobacco comments:      Never smoke 03/08/22   Substance and Sexual Activity   Alcohol use: No   Drug use: No   Sexual activity: Not on file  Other Topics Concern   Not on file  Social History Narrative   Not on file    Social Determinants of Health    Financial Resource Strain: Not on file  Food Insecurity: Not on file   Transportation Needs: Not on file  Physical Activity: Not on file  Stress: Not on file  Social Connections: Not on file  Intimate Partner Violence: Not on file        ROS- All systems are reviewed and negative except as per the HPI above.   Physical Exam:    Vitals:  03/08/22 1519  BP: 134/84  Pulse: (!) 125  Weight: 124.9 kg  Height: 6' (1.829 m)      GEN- The patient is a well appearing obese male, alert and oriented x 3 today.   Head- normocephalic, atraumatic Eyes-  Sclera clear, conjunctiva pink Ears- hearing intact Oropharynx- clear Neck- supple  Lungs- Clear to ausculation bilaterally, normal work of breathing Heart- irregular rate and rhythm, no murmurs, rubs or gallops  GI- soft, NT, ND, + BS Extremities- no clubbing, cyanosis, or edema MS- no significant deformity or atrophy Skin- no rash or lesion Psych- euthymic mood, full affect Neuro- strength and sensation are intact      Wt Readings from Last 3 Encounters:  03/08/22 124.9 kg  02/22/22 126.3 kg  11/09/20 118.3 kg      EKG today demonstrates  Coarse afib with RVR Vent. rate 125 BPM PR interval * ms QRS duration 80 ms QT/QTcB 326/470 ms   Echo 09/24/19 demonstrated   1. Left ventricular ejection fraction, by estimation, is 60 to 65%. The  left ventricle has normal function. The left ventricle has no regional  wall motion abnormalities. There is severe left ventricular hypertrophy.  Left ventricular diastolic parameters were normal.   2. Right ventricular systolic function is normal. The right ventricular  size is normal. There is normal pulmonary artery systolic pressure.   3. Left atrial size was mildly dilated.   4. The mitral valve is normal in structure. Trivial mitral valve  regurgitation. No evidence of mitral stenosis.   5. The aortic valve is tricuspid. Aortic valve regurgitation is trivial.  Mild aortic valve stenosis.   6. Aortic dilatation noted. There is mild dilatation of the  ascending  aorta measuring 38 mm.   7. The inferior vena cava is normal in size with greater than 50%  respiratory variability, suggesting right atrial pressure of 3 mmHg.    Epic records are reviewed at length today   CHA2DS2-VASc Score = 2  The patient's score is based upon: CHF History: 0 HTN History: 0 Diabetes History: 0 Stroke History: 0 Vascular Disease History: 1 Age Score: 1 Gender Score: 0         ASSESSMENT AND PLAN: 1. Persistent Atrial Fibrillation (ICD10:  I48.19) The patient's CHA2DS2-VASc score is 2, indicating a 2.2% annual risk of stroke.   Patient remains in persistent afib with elevated rates. We discussed rhythm control options today. Will plan for DCCV after 3 weeks of uninterrupted anticoagulation.  Continue Eliquis 5 mg BID (started 02/23/22) Increase Lopressor to 50 mg BID Repeat echo scheduled for 03/17/22   2. Secondary Hypercoagulable State (ICD10:  D68.69) The patient is at significant risk for stroke/thromboembolism based upon his CHA2DS2-VASc Score of 2.  Continue Apixaban (Eliquis).    3. Obesity Body mass index is 37.35 kg/m. Lifestyle modification was discussed at length including regular exercise and weight reduction.   4. Snoring/daytime somnolence  The importance of adequate treatment of sleep apnea was discussed today in order to improve our ability to maintain sinus rhythm long term. He has deferred sleep study for now.   5. HTN Stable, no changes today.     Follow up in the AF clinic post DCCV.      [facility] [address] [place], [place] [address] [phone] 03/08/2022 3:38 PM      For DCCV; compliant with apixaban; no changes. Kirk Ruths

## 2022-03-28 NOTE — Procedures (Signed)
Electrical Cardioversion Procedure Note KANNEN PEIRSON [id number] July 21, 1955  Procedure: Electrical Cardioversion Indications:  Atrial Fibrillation  Procedure Details Consent: Risks of procedure as well as the alternatives and risks of each were explained to the (patient/caregiver).  Consent for procedure obtained. Time Out: Verified patient identification, verified procedure, site/side was marked, verified correct patient position, special equipment/implants available, medications/allergies/relevent history reviewed, required imaging and test results available.  Performed  Patient placed on cardiac monitor, pulse oximetry, supplemental oxygen as necessary.  Sedation given:  Pt sedated by anesthesia with lidocaine 60 mg and diprovan 80 mg IV. Pacer pads placed anterior and posterior chest.  Cardioverted 1 time(s).  Cardioverted at [place].  Evaluation Findings: Post procedure EKG shows: Sinus bradycardia Complications: None Patient did tolerate procedure well.   Kirk Ruths 03/28/2022, 7:30 AM

## 2022-03-28 NOTE — Transfer of Care (Signed)
Immediate Anesthesia Transfer of Care Note  Patient: David Stephens  Procedure(s) Performed: CARDIOVERSION  Patient Location: PACU  Anesthesia Type:General  Level of Consciousness: drowsy  Airway & Oxygen Therapy: Patient Spontanous Breathing  Post-op Assessment: Report given to RN and Post -op Vital signs reviewed and stable  Post vital signs: Reviewed and stable  Last Vitals:  Vitals Value Taken Time  BP    Temp    Pulse    Resp    SpO2      Last Pain:  Vitals:   03/28/22 0734  TempSrc: Temporal  PainSc: 0-No pain         Complications: No notable events documented.

## 2022-03-28 NOTE — Interval H&P Note (Signed)
History and Physical Interval Note:  03/28/2022 7:32 AM  David Stephens  has presented today for surgery, with the diagnosis of AFIB.  The various methods of treatment have been discussed with the patient and family. After consideration of risks, benefits and other options for treatment, the patient has consented to  Procedure(s): CARDIOVERSION (N/A) as a surgical intervention.  The patient's history has been reviewed, patient examined, no change in status, stable for surgery.  I have reviewed the patient's chart and labs.  Questions were answered to the patient's satisfaction.     Kirk Ruths

## 2022-03-28 NOTE — Anesthesia Preprocedure Evaluation (Signed)
Anesthesia Evaluation  Patient identified by MRN, date of birth, ID band Patient awake    Reviewed: Allergy & Precautions, NPO status , Patient's Chart, lab work & pertinent test results  History of Anesthesia Complications (+) PONV and history of anesthetic complications  Airway Mallampati: II  TM Distance: >3 FB Neck ROM: Full    Dental  (+) Dental Advisory Given, Chipped   Pulmonary neg pulmonary ROS   Pulmonary exam normal        Cardiovascular hypertension, Pt. on home beta blockers and Pt. on medications + dysrhythmias Atrial Fibrillation  Rhythm:Irregular Rate:Normal     Neuro/Psych negative neurological ROS  negative psych ROS   GI/Hepatic negative GI ROS, Neg liver ROS,,,  Endo/Other  negative endocrine ROS    Renal/GU CRFRenal disease     Musculoskeletal  (+) Arthritis ,    Abdominal   Peds  Hematology  On eliquis    Anesthesia Other Findings   Reproductive/Obstetrics                             Anesthesia Physical Anesthesia Plan  ASA: 3  Anesthesia Plan: General   Post-op Pain Management:    Induction: Intravenous  PONV Risk Score and Plan: 3 and Treatment may vary due to age or medical condition and Propofol infusion  Airway Management Planned: Natural Airway and Mask  Additional Equipment: None  Intra-op Plan:   Post-operative Plan:   Informed Consent: I have reviewed the patients History and Physical, chart, labs and discussed the procedure including the risks, benefits and alternatives for the proposed anesthesia with the patient or authorized representative who has indicated his/her understanding and acceptance.       Plan Discussed with: CRNA and Anesthesiologist  Anesthesia Plan Comments:        Anesthesia Quick Evaluation

## 2022-03-28 NOTE — Anesthesia Procedure Notes (Signed)
Procedure Name: General with mask airway Date/Time: 03/28/2022 8:14 AM  Performed by: Valda Favia, CRNAPre-anesthesia Checklist: Patient identified, Emergency Drugs available, Suction available, Patient being monitored and Timeout performed Patient Re-evaluated:Patient Re-evaluated prior to induction Oxygen Delivery Method: Ambu bag Preoxygenation: Pre-oxygenation with 100% oxygen Induction Type: IV induction Placement Confirmation: positive ETCO2 Dental Injury: Teeth and Oropharynx as per pre-operative assessment

## 2022-03-28 NOTE — Anesthesia Postprocedure Evaluation (Signed)
Anesthesia Post Note  Patient: David Stephens  Procedure(s) Performed: CARDIOVERSION     Patient location during evaluation: Endoscopy Anesthesia Type: General Level of consciousness: awake and sedated Pain management: pain level controlled Vital Signs Assessment: post-procedure vital signs reviewed and stable Respiratory status: spontaneous breathing Cardiovascular status: stable Postop Assessment: no apparent nausea or vomiting Anesthetic complications: no  No notable events documented.  Last Vitals:  Vitals:   03/28/22 0818 03/28/22 0828  BP: 124/83 120/87  Pulse: (!) 54 (!) 52  Resp: (!) 24 14  Temp:    SpO2: 95% 98%    Last Pain:  Vitals:   03/28/22 0828  TempSrc:   PainSc: 0-No pain                 Huston Foley

## 2022-03-30 ENCOUNTER — Encounter (HOSPITAL_COMMUNITY): Payer: Self-pay | Admitting: Cardiology

## 2022-04-12 ENCOUNTER — Ambulatory Visit (HOSPITAL_COMMUNITY)
Admission: RE | Admit: 2022-04-12 | Discharge: 2022-04-12 | Disposition: A | Discharge status: Home / Self Care | Payer: 59 | Source: Ambulatory Visit | Attending: Physician Assistant | Admitting: Physician Assistant

## 2022-04-12 VITALS — BP 142/72 | HR 64 | Ht 72.0 in | Wt 273.2 lb

## 2022-04-12 DIAGNOSIS — Z6837 Body mass index (BMI) 37.0-37.9, adult: Secondary | ICD-10-CM | POA: Diagnosis not present

## 2022-04-12 DIAGNOSIS — I10 Essential (primary) hypertension: Secondary | ICD-10-CM | POA: Diagnosis not present

## 2022-04-12 DIAGNOSIS — I4819 Other persistent atrial fibrillation: Secondary | ICD-10-CM

## 2022-04-12 DIAGNOSIS — Z7901 Long term (current) use of anticoagulants: Secondary | ICD-10-CM | POA: Insufficient documentation

## 2022-04-12 DIAGNOSIS — Z7182 Exercise counseling: Secondary | ICD-10-CM | POA: Insufficient documentation

## 2022-04-12 DIAGNOSIS — E669 Obesity, unspecified: Secondary | ICD-10-CM | POA: Insufficient documentation

## 2022-04-12 DIAGNOSIS — E785 Hyperlipidemia, unspecified: Secondary | ICD-10-CM | POA: Diagnosis not present

## 2022-04-12 DIAGNOSIS — Z79899 Other long term (current) drug therapy: Secondary | ICD-10-CM | POA: Insufficient documentation

## 2022-04-12 DIAGNOSIS — D6869 Other thrombophilia: Secondary | ICD-10-CM

## 2022-04-12 DIAGNOSIS — I712 Thoracic aortic aneurysm, without rupture, unspecified: Secondary | ICD-10-CM

## 2022-04-12 DIAGNOSIS — I77810 Thoracic aortic ectasia: Secondary | ICD-10-CM

## 2022-04-12 DIAGNOSIS — I350 Nonrheumatic aortic (valve) stenosis: Secondary | ICD-10-CM

## 2022-04-12 MED ORDER — METOPROLOL TARTRATE 50 MG PO TABS: 50.0000 mg | ORAL_TABLET | Freq: Two times a day (BID) | ORAL | 3 refills | Status: DC

## 2022-04-12 NOTE — Progress Notes (Signed)
Primary Care Physician: [facility] [place] Primary Cardiologist: Dr Johney Frame Primary Electrophysiologist: none Referring Physician: Dr David Stephens is a 67 y.o. male with a history of HTN, severe LVH, mild AS, HLD, atrial fibrillation who presents for follow up in the [facility].  The patient was initially diagnosed with atrial fibrillation 02/22/22 after presenting to Dr Jacolyn Reedy office with symptoms of fatigue and intermittent lightheadedness. ECG showed new onset afib. He was started on Eliquis for a CHADS2VASC score of 2 and metoprolol for rate control. He denies alcohol use but does admit to snoring and daytime somnolence.    On follow up today, patient is s/p DCCV on 03/28/22. He reports that he feels well today. He has more energy in SR. No bleeding issues on anticoagulation.   Today, he denies symptoms of palpitations, chest pain, orthopnea, PND, lower extremity edema, presyncope, syncope, bleeding, or neurologic sequela. The patient is tolerating medications without difficulties and is otherwise without complaint today.    Atrial Fibrillation Risk Factors:  he does have symptoms or diagnosis of sleep apnea. he is not ready to do a sleep study at this time.  he does not have a history of rheumatic fever. he does not have a history of alcohol use. The patient does not have a history of early familial atrial fibrillation or other arrhythmias.  he has a BMI of Body mass index is 37.05 kg/m.Marland Kitchen Filed Weights   04/12/22 1520  Weight: 123.9 kg    No family history on file.   Atrial Fibrillation Management history:  Previous antiarrhythmic drugs: none Previous cardioversions: 03/28/22 Previous ablations: none CHADS2VASC score: 2 Anticoagulation history: Eliquis   Past Medical History:  Diagnosis Date   Anginal pain ([place])    Arthritis    Chest/Aorta CTA 11/2020   CT 11/22: normal sized aorta; no sig  coronary artery Ca2+   Hyperlipidemia    patient states he takes medication for this but ran out and has stopped it.   Inguinal hernia    Kidney stones    PONV (postoperative nausea and vomiting)    Umbilical hernia    Past Surgical History:  Procedure Laterality Date   BUNIONECTOMY     CARDIOVERSION N/A 03/28/2022   Procedure: CARDIOVERSION;  Surgeon: Lelon Perla, MD;  Location[facility] ENDOSCOPY;  Service: Cardiovascular;  Laterality: N/A;   COLONOSCOPY     HERNIA REPAIR     KNEE SURGERY Bilateral    several   multiple tooth extraction     NM MYOVIEW LTD  February 2009   No reversal perfusion defect to suggest ischemia. No infarct. Mild global hypokinesis with EF 47%.   TOTAL KNEE ARTHROPLASTY Right 10/09/2014   TOTAL KNEE ARTHROPLASTY Right 10/09/2014   Procedure: TOTAL KNEE ARTHROPLASTY;  Surgeon: Earlie Server, MD;  Location: [facility];  Service: Orthopedics;  Laterality: Right;    Current Outpatient Medications  Medication Sig Dispense Refill   amLODipine (NORVASC) 5 MG tablet Take 1 tablet (5 mg total) by mouth daily. 90 tablet 2   amoxicillin (AMOXIL) 500 MG tablet Take 2,000 mg by mouth once.     apixaban (ELIQUIS) 5 MG TABS tablet Take 1 tablet (5 mg total) by mouth 2 (two) times daily. 60 tablet 6   atorvastatin (LIPITOR) 40 MG tablet Take 1 tablet (40 mg total) by mouth daily. 30 tablet 0   Cholecalciferol (VITAMIN D3) 10 MCG (400 UNIT) tablet Take 400 Units by  mouth daily.     losartan (COZAAR) 50 MG tablet Take 1 tablet (50 mg total) by mouth daily. 90 tablet 3   metoprolol tartrate (LOPRESSOR) 25 MG tablet Take 2 tablets (50 mg total) by mouth 2 (two) times daily. 180 tablet 2   Multiple Vitamin (MULTIVITAMIN) tablet Take 1 tablet by mouth daily.     No current facility-administered medications for this encounter.    Allergies  Allergen Reactions   Clindamycin/Lincomycin Diarrhea    Social History   Socioeconomic History   Marital status: Divorced    Spouse  name: Not on file   Number of children: Not on file   Years of education: Not on file   Highest education level: Not on file  Occupational History   Not on file  Tobacco Use   Smoking status: Never   Smokeless tobacco: Never   Tobacco comments:    Never smoke 03/08/22   Substance and Sexual Activity   Alcohol use: No   Drug use: No   Sexual activity: Not on file  Other Topics Concern   Not on file  Social History Narrative   Not on file   Social Determinants of Health   Financial Resource Strain: Not on file  Food Insecurity: Not on file  Transportation Needs: Not on file  Physical Activity: Not on file  Stress: Not on file  Social Connections: Not on file  Intimate Partner Violence: Not on file     ROS- All systems are reviewed and negative except as per the HPI above.  Physical Exam: Vitals:   04/12/22 1520  Weight: 123.9 kg  Height: 6' (1.829 m)    GEN- The patient is a well appearing male, alert and oriented x 3 today.   HEENT-head normocephalic, atraumatic, sclera clear, conjunctiva pink, hearing intact, trachea midline. Lungs- Clear to ausculation bilaterally, normal work of breathing Heart- Regular rate and rhythm, no murmurs, rubs or gallops  GI- soft, NT, ND, + BS Extremities- no clubbing, cyanosis, or edema MS- no significant deformity or atrophy Skin- no rash or lesion Psych- euthymic mood, full affect Neuro- strength and sensation are intact   Wt Readings from Last 3 Encounters:  04/12/22 123.9 kg  03/28/22 123.8 kg  03/08/22 124.9 kg    EKG today demonstrates  SR Vent. rate 64 BPM PR interval 160 ms QRS duration 82 ms QT/QTcB 444/458 ms  Echo 03/17/22 demonstrated   1. Left ventricular ejection fraction, by estimation, is 45%. The left  ventricle has mildly decreased function. The left ventricle demonstrates  global hypokinesis. There is moderate concentric left ventricular  hypertrophy. Left ventricular diastolic parameters are  indeterminate.   2. Right ventricular systolic function is low normal. The right  ventricular size is mildly enlarged. There is normal pulmonary artery  systolic pressure.   3. Left atrial size was mildly dilated.   4. Right atrial size was mildly dilated.   5. The mitral valve is grossly normal. Mild mitral valve regurgitation.   6. The aortic valve is tricuspid. There is mild calcification of the  aortic valve. There is mild thickening of the aortic valve. Aortic valve  regurgitation is trivial. Aortic valve sclerosis is present, with no  evidence of aortic valve stenosis.   7. Aortic dilatation noted. There is mild dilatation of the ascending  aorta, measuring 40 mm.   8. The inferior vena cava is normal in size with greater than 50%  respiratory variability, suggesting right atrial pressure of 3 mmHg.  Comparison(s): Prior images reviewed side by side. LV function has  decreased from prior; aortic dimension has increased; prior study done in  sinus rhythm.   Epic records are reviewed at length today  CHA2DS2-VASc Score = 2  The patient's score is based upon: CHF History: 0 HTN History: 0 Diabetes History: 0 Stroke History: 0 Vascular Disease History: 1 Age Score: 1 Gender Score: 0       ASSESSMENT AND PLAN: 1. Persistent Atrial Fibrillation (ICD10:  I48.19) The patient's CHA2DS2-VASc score is 2, indicating a 2.2% annual risk of stroke.   S/p DCCV 03/28/22 Patient back in [place].  Can consider AAD or ablation if he has quick return of his afib.  Continue Eliquis 5 mg BID  Continue Lopressor 50 mg BID  2. Secondary Hypercoagulable State (ICD10:  D68.69) The patient is at significant risk for stroke/thromboembolism based upon his CHA2DS2-VASc Score of 2.  Continue Apixaban (Eliquis).   3. Obesity Body mass index is 37.05 kg/m. Lifestyle modification was discussed and encouraged including regular physical activity and weight reduction.  4. Snoring/daytime somnolence  He  has deferred sleep study for now.  5. HTN Stable, no changes today.    Follow up with Dr Johney Frame as scheduled.    [facility] [address] [place], [place] [address] [phone] 04/12/2022 3:33 PM

## 2022-05-21 NOTE — Progress Notes (Unsigned)
Cardiology Office Note:    Date:  05/23/2022   ID:  David Stephens, David Stephens [birth year], MRN [id number]  PCP:  [facility] Cardiologist:  Meriam Sprague, MD  [facility] Electrophysiologist:  None   Referring MD: [facility]    History of Present Illness:    David Stephens is a 67 y.o. male with a hx of hypertension with evidence of severe LVH on TTE, mild aortic stenosis, and hyperlipidemia who presents to clinic for follow-up.  Patient with history of mild AS on TTE in 2021 which showed EF 60-65, no RWMA, severe LVH, normal RVSF, mild LAE, trivial MR, mild aortic stenosis (mean 8.6 mmHg, V-max 210 cm/s, DI 0.47), mild dilation of the ascending aorta (38 mm). Was last seen by Tereso Newcomer in 10/2020 where he was doing well. Remained active without anginal symptoms.  Was seen in clinic on 01/2022 where he was found to be in Afib at that time. Was experiencing more fatigue. Was started on metop and apixaban and underwent DCCV on 03/28/22 with return to NSR.   Today, the patient overall feels well. No chest pain, SOB, palpitations, LE edema, or orthopnea. Had minor dizziness with position changes but this has improved. Has not been monitored BP at home but will check it more frequently. No bleeding issues on the apixaban.   Past Medical History:  Diagnosis Date   Anginal pain (HCC)    Arthritis    Chest/Aorta CTA 11/2020   CT 11/22: normal sized aorta; no sig coronary artery Ca2+   Hyperlipidemia    patient states he takes medication for this but ran out and has stopped it.   Inguinal hernia    Kidney stones    PONV (postoperative nausea and vomiting)    Umbilical hernia     Past Surgical History:  Procedure Laterality Date   BUNIONECTOMY     CARDIOVERSION N/A 03/28/2022   Procedure: CARDIOVERSION;  Surgeon: Lewayne Bunting, MD;  Location: [facility] ENDOSCOPY;  Service: Cardiovascular;  Laterality: N/A;   COLONOSCOPY      HERNIA REPAIR     KNEE SURGERY Bilateral    several   multiple tooth extraction     NM MYOVIEW LTD  February 2009   No reversal perfusion defect to suggest ischemia. No infarct. Mild global hypokinesis with EF 47%.   TOTAL KNEE ARTHROPLASTY Right 10/09/2014   TOTAL KNEE ARTHROPLASTY Right 10/09/2014   Procedure: TOTAL KNEE ARTHROPLASTY;  Surgeon: Frederico Hamman, MD;  Location: [facility] OR;  Service: Orthopedics;  Laterality: Right;    Current Medications: Current Meds  Medication Sig   amLODipine (NORVASC) 5 MG tablet Take 1 tablet (5 mg total) by mouth daily.   amoxicillin (AMOXIL) 500 MG tablet Take 2,000 mg by mouth once.   apixaban (ELIQUIS) 5 MG TABS tablet Take 1 tablet (5 mg total) by mouth 2 (two) times daily.   atorvastatin (LIPITOR) 40 MG tablet Take 1 tablet (40 mg total) by mouth daily.   Cholecalciferol (VITAMIN D3) 10 MCG (400 UNIT) tablet Take 400 Units by mouth daily.   losartan (COZAAR) 50 MG tablet Take 1 tablet (50 mg total) by mouth daily.   metoprolol tartrate (LOPRESSOR) 50 MG tablet Take 1 tablet (50 mg total) by mouth 2 (two) times daily.   Multiple Vitamin (MULTIVITAMIN) tablet Take 1 tablet by mouth daily.     Allergies:   Clindamycin/lincomycin   Social History   Socioeconomic History  Marital status: Divorced    Spouse name: Not on file   Number of children: Not on file   Years of education: Not on file   Highest education level: Not on file  Occupational History   Not on file  Tobacco Use   Smoking status: Never   Smokeless tobacco: Never   Tobacco comments:    Never smoke 03/08/22   Substance and Sexual Activity   Alcohol use: No   Drug use: No   Sexual activity: Not on file  Other Topics Concern   Not on file  Social History Narrative   Not on file   Social Determinants of Health   Financial Resource Strain: Not on file  Food Insecurity: Not on file  Transportation Needs: Not on file  Physical Activity: Not on file  Stress: Not on  file  Social Connections: Not on file     Family History: The patient's family history is not on file.  ROS:   Please see the history of present illness.    The patient denies chest pain, chest pressure, dyspnea at rest or with exertion, palpitations, PND, orthopnea, or leg swelling. Denies cough, fever, chills. Denies nausea, vomiting. Denies syncope or presyncope. Denies dizziness or lightheadedness. Denies snoring.  EKGs/Labs/Other Studies Reviewed:    The following studies were reviewed today: Cardiac Monitor 11/2020: Sinus rhythm with average heart rate of 60 bpm Frequent premature atrial contractions, PAC"s (6%) - no symptoms reported Rare premature ventricular contractions, PVC's Rare atrial tachycardia (longest 20 beats) - benign No atrial fibrillation, no pauses, no VT  TTE 09/24/19: 1. Left ventricular ejection fraction, by estimation, is 60 to 65%. The  left ventricle has normal function. The left ventricle has no regional  wall motion abnormalities. There is severe left ventricular hypertrophy.  Left ventricular diastolic parameters   were normal.   2. Right ventricular systolic function is normal. The right ventricular  size is normal. There is normal pulmonary artery systolic pressure.   3. Left atrial size was mildly dilated.   4. The mitral valve is normal in structure. Trivial mitral valve  regurgitation. No evidence of mitral stenosis.   5. The aortic valve is tricuspid. Aortic valve regurgitation is trivial.  Mild aortic valve stenosis.   6. Aortic dilatation noted. There is mild dilatation of the ascending  aorta measuring 38 mm.   7. The inferior vena cava is normal in size with greater than 50%  respiratory variability, suggesting right atrial pressure of 3 mmHg.   TTE 09/2014: - Left ventricle: The cavity size was normal. Wall thickness was    increased in a pattern of moderate LVH. Systolic function was    normal. The estimated ejection fraction was in  the range of 60%    to 65%.  - Aortic valve: There was trivial regurgitation.  - Mitral valve: There was mild regurgitation.  - Left atrium: The atrium was mildly to moderately dilated.  Nuclear stress 2009: IMPRESSION:  1. No areas of reversibility to suggest inducible ischemia.  2. Mild global hypokinesis.  3. Ejection fraction estimated at 47%.   EKG:  No new tracing today  Recent Labs: 03/28/2022: BUN 25; Creatinine, Ser 1.90; Hemoglobin 14.6; Potassium 3.8; Sodium 143  Recent Lipid Panel No results found for: "CHOL", "TRIG", "HDL", "CHOLHDL", "VLDL", "LDLCALC", "LDLDIRECT"  Physical Exam:    VS:  BP (!) 142/76   Pulse (!) 53   Ht 6' (1.829 m)   Wt 272 lb 9.6 oz (123.7 kg)  SpO2 94%   BMI 36.97 kg/m     Wt Readings from Last 3 Encounters:  05/23/22 272 lb 9.6 oz (123.7 kg)  04/12/22 273 lb 3.2 oz (123.9 kg)  03/28/22 273 lb (123.8 kg)     GEN:  Well nourished, well developed in no acute distress HEENT: Normal NECK: No JVD; No carotid bruits CARDIAC: Bradycardic, regular, 2+ systolic murmur best heard at RUSB RESPIRATORY:  Clear to auscultation without rales, wheezing or rhonchi  ABDOMEN: Soft, non-tender, non-distended MUSCULOSKELETAL:  No edema; No deformity  SKIN: Warm and dry NEUROLOGIC:  Alert and oriented x 3 PSYCHIATRIC:  Normal affect   ASSESSMENT:    1. Persistent atrial fibrillation (HCC)   2. Mild aortic stenosis   3. Hypercoagulable state due to persistent atrial fibrillation (HCC)   4. Essential hypertension   5. PVC's (premature ventricular contractions)   6. Ascending aorta dilatation (HCC)   7. Pure hypercholesterolemia      PLAN:    In order of problems listed above:  #Paroxysmal Afib: CHADs-vasc 2. Diagnosed during clinic visit on 01/2022 now s/p DCCV in 03/2022. Currently doing well and maintaining NSR.  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 -Continue amlodipine 5mg  daily -Continue losartan 50mg  daily -Continue metop 50 BID as above  #Hyperlipidemia -Continue atorvastatin 40mg  -Lipids managed by PCP  #TAA: -Measures 40mm on TTE -Continue annual monitoring  #PVCs: -Continue metop 50mg  BID  #CKD IIIB: -Labs followed by PCP  Medication Adjustments/Labs and Tests Ordered: Current medicines are reviewed at length with the patient today.  Concerns regarding medicines are outlined above.  Orders Placed This Encounter  Procedures   ECHOCARDIOGRAM COMPLETE   No orders of the defined types were placed in this encounter.   Patient Instructions  Medication Instructions:   Your physician recommends that you continue on your current medications as directed. Please refer to the Current Medication list given to you today.  *If you need a refill on your cardiac medications before your next appointment, please call your pharmacy*   Testing/Procedures:  Your physician has requested that you have an echocardiogram. Echocardiography is a painless test that uses sound waves to create images of your heart. It provides your doctor with information about the size and shape of your heart and how well your heart's chambers and valves are working. This procedure takes approximately one hour. There are no restrictions for this procedure.  SCHEDULE ECHO TO BE DONE IN JUNE 2024 PER DR. Shari Prows  Please do NOT wear cologne, perfume, aftershave, or lotions (deodorant is allowed). Please arrive 15 minutes prior to your appointment time.    Follow-Up: At [facility], you and your health needs are our priority.  As part of our continuing mission to provide you with exceptional heart care, we have created designated Provider Care Teams.  These Care Teams  include your primary Cardiologist (physician) and Advanced Practice Providers (APPs -  Physician Assistants and Nurse Practitioners) who all work together to provide you with the care you need, when you need it.  We recommend signing up for the patient portal called "MyChart".  Sign up information is provided on this After Visit Summary.  MyChart is used to connect with patients for Virtual  Visits (Telemedicine).  Patients are able to view lab/test results, encounter notes, upcoming appointments, etc.  Non-urgent messages can be sent to your provider as well.   To learn more about what you can do with MyChart, go to [email or web address].    Your next appointment:   6 month(s)  Provider:   Meriam Sprague, MD         Signed, Meriam Sprague, MD  05/23/2022 3:52 PM    [facility]

## 2022-05-23 ENCOUNTER — Ambulatory Visit: Payer: 59 | Attending: Cardiology | Admitting: Cardiology

## 2022-05-23 ENCOUNTER — Encounter: Payer: Self-pay | Admitting: Cardiology

## 2022-05-23 VITALS — BP 142/76 | HR 53 | Ht 72.0 in | Wt 272.6 lb

## 2022-05-23 DIAGNOSIS — I10 Essential (primary) hypertension: Secondary | ICD-10-CM

## 2022-05-23 DIAGNOSIS — D6869 Other thrombophilia: Secondary | ICD-10-CM

## 2022-05-23 DIAGNOSIS — I350 Nonrheumatic aortic (valve) stenosis: Secondary | ICD-10-CM | POA: Diagnosis not present

## 2022-05-23 DIAGNOSIS — I493 Ventricular premature depolarization: Secondary | ICD-10-CM

## 2022-05-23 DIAGNOSIS — I77810 Thoracic aortic ectasia: Secondary | ICD-10-CM

## 2022-05-23 DIAGNOSIS — I4819 Other persistent atrial fibrillation: Secondary | ICD-10-CM | POA: Diagnosis not present

## 2022-05-23 DIAGNOSIS — E7800 Pure hypercholesterolemia, unspecified: Secondary | ICD-10-CM

## 2022-05-23 NOTE — Patient Instructions (Signed)
Medication Instructions:   Your physician recommends that you continue on your current medications as directed. Please refer to the Current Medication list given to you today.  *If you need a refill on your cardiac medications before your next appointment, please call your pharmacy*   Testing/Procedures:  Your physician has requested that you have an echocardiogram. Echocardiography is a painless test that uses sound waves to create images of your heart. It provides your doctor with information about the size and shape of your heart and how well your heart's chambers and valves are working. This procedure takes approximately one hour. There are no restrictions for this procedure.  SCHEDULE ECHO TO BE DONE IN JUNE 2024 PER DR. Shari Prows  Please do NOT wear cologne, perfume, aftershave, or lotions (deodorant is allowed). Please arrive 15 minutes prior to your appointment time.    Follow-Up: At [facility], you and your health needs are our priority.  As part of our continuing mission to provide you with exceptional heart care, we have created designated Provider Care Teams.  These Care Teams include your primary Cardiologist (physician) and Advanced Practice Providers (APPs -  Physician Assistants and Nurse Practitioners) who all work together to provide you with the care you need, when you need it.  We recommend signing up for the patient portal called "MyChart".  Sign up information is provided on this After Visit Summary.  MyChart is used to connect with patients for Virtual Visits (Telemedicine).  Patients are able to view lab/test results, encounter notes, upcoming appointments, etc.  Non-urgent messages can be sent to your provider as well.   To learn more about what you can do with MyChart, go to [email or web address].    Your next appointment:   6 month(s)  Provider:   Meriam Sprague, MD

## 2022-05-29 ENCOUNTER — Telehealth: Payer: Self-pay | Admitting: *Deleted

## 2022-05-29 DIAGNOSIS — I350 Nonrheumatic aortic (valve) stenosis: Secondary | ICD-10-CM

## 2022-05-29 DIAGNOSIS — I77810 Thoracic aortic ectasia: Secondary | ICD-10-CM

## 2022-05-29 DIAGNOSIS — I712 Thoracic aortic aneurysm, without rupture, unspecified: Secondary | ICD-10-CM

## 2022-05-29 DIAGNOSIS — I4819 Other persistent atrial fibrillation: Secondary | ICD-10-CM

## 2022-05-29 MED ORDER — METOPROLOL TARTRATE 50 MG PO TABS: 50.0000 mg | ORAL_TABLET | Freq: Two times a day (BID) | ORAL | 3 refills | Status: DC

## 2022-05-29 NOTE — Telephone Encounter (Signed)
Pt reached out to get a refill of his lopressor 50 mg bid sent to his pharmacy of choice.  Refill request sent to the pts confirmed pharmacy of choice.

## 2022-05-31 ENCOUNTER — Ambulatory Visit
Admission: EM | Admit: 2022-05-31 | Discharge: 2022-05-31 | Disposition: A | Discharge status: Home / Self Care | Payer: 59 | Attending: Internal Medicine | Admitting: Internal Medicine

## 2022-05-31 DIAGNOSIS — J029 Acute pharyngitis, unspecified: Secondary | ICD-10-CM | POA: Diagnosis not present

## 2022-05-31 HISTORY — DX: Essential (primary) hypertension: I10

## 2022-05-31 LAB — POCT RAPID STREP A (OFFICE): Rapid Strep A Screen: NEGATIVE

## 2022-05-31 MED ORDER — AMOXICILLIN-POT CLAVULANATE 875-125 MG PO TABS: 1.0000 | ORAL_TABLET | Freq: Two times a day (BID) | ORAL | 0 refills | Status: DC

## 2022-05-31 NOTE — ED Provider Notes (Signed)
[facility]    CSN: [id number] Arrival date & time: 05/31/22  1811      History   Chief Complaint Chief Complaint  Patient presents with   Sore Throat    HPI David Stephens is a 67 y.o. male.   Patient presents with sore throat that started about 2 to 3 days ago.  Denies nasal congestion, runny nose, cough.  Patient reports that he felt feverish but did not take temperature with thermometer.  Denies any known sick contacts.  Has not taken any medications for symptoms.   Sore Throat    Past Medical History:  Diagnosis Date   Anginal pain (HCC)    Arthritis    Chest/Aorta CTA 11/2020   CT 11/22: normal sized aorta; no sig coronary artery Ca2+   Hyperlipidemia    patient states he takes medication for this but ran out and has stopped it.   Hypertension    Inguinal hernia    Kidney stones    PONV (postoperative nausea and vomiting)    Umbilical hernia     Patient Active Problem List   Diagnosis Date Noted   Hypercoagulable state due to persistent atrial fibrillation (HCC) 03/08/2022   Persistent atrial fibrillation (HCC) 03/08/2022   Plantar fasciitis 09/06/2016   Primary localized osteoarthritis of right knee 10/09/2014   Pre-operative cardiovascular examination 10/04/2014   Decreased cardiac ejection fraction 10/04/2014   Essential hypertension 10/04/2014    Past Surgical History:  Procedure Laterality Date   BUNIONECTOMY     CARDIOVERSION N/A 03/28/2022   Procedure: CARDIOVERSION;  Surgeon: Lewayne Bunting, MD;  Location: [facility] ENDOSCOPY;  Service: Cardiovascular;  Laterality: N/A;   COLONOSCOPY     HERNIA REPAIR     KNEE SURGERY Bilateral    several   multiple tooth extraction     NM MYOVIEW LTD  February 2009   No reversal perfusion defect to suggest ischemia. No infarct. Mild global hypokinesis with EF 47%.   TOTAL KNEE ARTHROPLASTY Right 10/09/2014   TOTAL KNEE ARTHROPLASTY Right 10/09/2014   Procedure: TOTAL KNEE ARTHROPLASTY;  Surgeon:  Frederico Hamman, MD;  Location: [facility] OR;  Service: Orthopedics;  Laterality: Right;       Home Medications    Prior to Admission medications   Medication Sig Start Date End Date Taking? Authorizing Provider  amoxicillin-clavulanate (AUGMENTIN) 875-125 MG tablet Take 1 tablet by mouth every 12 (twelve) hours. 05/31/22  Yes Marvie Brevik, Rolly Salter E, FNP  amLODipine (NORVASC) 5 MG tablet Take 1 tablet (5 mg total) by mouth daily. 02/22/22   Meriam Sprague, MD  amoxicillin (AMOXIL) 500 MG tablet Take 2,000 mg by mouth once.    [provider]  apixaban (ELIQUIS) 5 MG TABS tablet Take 1 tablet (5 mg total) by mouth 2 (two) times daily. 02/22/22   Meriam Sprague, MD  atorvastatin (LIPITOR) 40 MG tablet Take 1 tablet (40 mg total) by mouth daily. 11/30/21   Meriam Sprague, MD  Cholecalciferol (VITAMIN D3) 10 MCG (400 UNIT) tablet Take 400 Units by mouth daily.    [provider]  losartan (COZAAR) 50 MG tablet Take 1 tablet (50 mg total) by mouth daily. 02/22/22   Meriam Sprague, MD  metoprolol tartrate (LOPRESSOR) 50 MG tablet Take 1 tablet (50 mg total) by mouth 2 (two) times daily. 05/29/22   Meriam Sprague, MD  Multiple Vitamin (MULTIVITAMIN) tablet Take 1 tablet by mouth daily.    [provider]    [facility]  History History reviewed. No pertinent family history.  Social History Social History   Tobacco Use   Smoking status: Never   Smokeless tobacco: Never   Tobacco comments:    Never smoke 03/08/22   Substance Use Topics   Alcohol use: No   Drug use: No     Allergies   Clindamycin/lincomycin   Review of Systems Review of Systems Per HPI  Physical Exam Triage Vital Signs ED Triage Vitals [05/31/22 1931]  Enc Vitals Group     BP (!) 145/84     Pulse Rate 76     Resp 16     Temp 99.4 F (37.4 C)     Temp Source Oral     SpO2 95 %     Weight      Height      Head Circumference      Peak Flow      Pain Score 8     Pain Loc       Pain Edu?      Excl. in GC?    No data found.  Updated Vital Signs BP (!) 145/84 (BP Location: Left Arm)   Pulse 76   Temp 99.4 F (37.4 C) (Oral)   Resp 16   SpO2 95%   Visual Acuity Right Eye Distance:   Left Eye Distance:   Bilateral Distance:    Right Eye Near:   Left Eye Near:    Bilateral Near:     Physical Exam Constitutional:      General: He is not in acute distress.    Appearance: Normal appearance. He is not toxic-appearing or diaphoretic.  HENT:     Head: Normocephalic and atraumatic.     Right Ear: Tympanic membrane and ear canal normal.     Left Ear: Tympanic membrane and ear canal normal.     Nose: No congestion.     Mouth/Throat:     Mouth: Mucous membranes are moist.     Pharynx: Posterior oropharyngeal erythema present. No oropharyngeal exudate.     Tonsils: Tonsillar exudate present. No tonsillar abscesses. 1+ on the right. 1+ on the left.  Eyes:     Extraocular Movements: Extraocular movements intact.     Conjunctiva/sclera: Conjunctivae normal.     Pupils: Pupils are equal, round, and reactive to light.  Cardiovascular:     Rate and Rhythm: Normal rate and regular rhythm.     Pulses: Normal pulses.     Heart sounds: Normal heart sounds.  Pulmonary:     Effort: Pulmonary effort is normal. No respiratory distress.     Breath sounds: Normal breath sounds. No stridor. No wheezing, rhonchi or rales.  Abdominal:     General: Abdomen is flat. Bowel sounds are normal.     Palpations: Abdomen is soft.  Musculoskeletal:        General: Normal range of motion.     Cervical back: Normal range of motion.  Skin:    General: Skin is warm and dry.  Neurological:     General: No focal deficit present.     Mental Status: He is alert and oriented to person, place, and time. Mental status is at baseline.  Psychiatric:        Mood and Affect: Mood normal.        Behavior: Behavior normal.        Thought Content: Thought content normal.        Judgment:  Judgment normal.  UC Treatments / Results  Labs (all labs ordered are listed, but only abnormal results are displayed) Labs Reviewed  CULTURE, GROUP A STREP [facility])  POCT RAPID STREP A (OFFICE)    EKG   Radiology No results found.  Procedures Procedures (including critical care time)  Medications Ordered in UC Medications - No data to display  Initial Impression / Assessment and Plan / UC Course  I have reviewed the triage vital signs and the nursing notes.  Pertinent labs & imaging results that were available during my care of the patient were reviewed by me and considered in my medical decision making (see chart for details).     Strep test was negative but I am still highly suspicious of strep throat given appearance of posterior pharynx on exam.  Will treat with Augmentin given patient was recently treated with antibiotics a few weeks prior for dental procedure.  Patient reports that he only had one-time dose of amoxicillin for dental procedure.  Creatinine clearance is 66 so no dosage adjustment necessary.  Throat culture pending.  No peritonsillar abscess noted on exam.  Discussed with patient the possibility that viral illness could be etiology and supportive care related to this.  Offered COVID testing but patient declined.  Discussed return precautions.  Patient verbalized understanding and was agreeable with plan. Final Clinical Impressions(s) / UC Diagnoses   Final diagnoses:  Sore throat     Discharge Instructions      Strep was negative but as we discussed I am still suspicious of strep throat so I am treating with antibiotic.  Take with food to avoid stomach upset.  Follow-up if any symptoms persist or worsen.    ED Prescriptions     Medication Sig Dispense Auth. Provider   amoxicillin-clavulanate (AUGMENTIN) 875-125 MG tablet Take 1 tablet by mouth every 12 (twelve) hours. 14 tablet [place], Acie Fredrickson, [place]      PDMP not reviewed this encounter.    Gustavus Bryant, [place] 05/31/22 [phone]

## 2022-05-31 NOTE — Discharge Instructions (Addendum)
Strep was negative but as we discussed I am still suspicious of strep throat so I am treating with antibiotic.  Take with food to avoid stomach upset.  Follow-up if any symptoms persist or worsen.

## 2022-05-31 NOTE — ED Triage Notes (Signed)
Pt c/o sore throat   Onset ~ Monday

## 2022-06-03 LAB — CULTURE, GROUP A STREP (THRC)

## 2022-07-10 ENCOUNTER — Ambulatory Visit (HOSPITAL_COMMUNITY): Payer: 59 | Attending: Cardiology

## 2022-07-10 DIAGNOSIS — I350 Nonrheumatic aortic (valve) stenosis: Secondary | ICD-10-CM | POA: Diagnosis present

## 2022-07-10 DIAGNOSIS — E7800 Pure hypercholesterolemia, unspecified: Secondary | ICD-10-CM | POA: Insufficient documentation

## 2022-07-10 DIAGNOSIS — I77810 Thoracic aortic ectasia: Secondary | ICD-10-CM | POA: Diagnosis present

## 2022-07-10 DIAGNOSIS — I10 Essential (primary) hypertension: Secondary | ICD-10-CM | POA: Insufficient documentation

## 2022-07-10 DIAGNOSIS — I4819 Other persistent atrial fibrillation: Secondary | ICD-10-CM | POA: Insufficient documentation

## 2022-07-10 DIAGNOSIS — I493 Ventricular premature depolarization: Secondary | ICD-10-CM | POA: Insufficient documentation

## 2022-07-10 DIAGNOSIS — D6869 Other thrombophilia: Secondary | ICD-10-CM | POA: Insufficient documentation

## 2022-07-10 DIAGNOSIS — I4891 Unspecified atrial fibrillation: Secondary | ICD-10-CM

## 2022-07-10 LAB — ECHOCARDIOGRAM COMPLETE
AR max vel: 2.5 cm2
AV Area VTI: 2.55 cm2
AV Area mean vel: 2.56 cm2
AV Mean grad: 8 mmHg
AV Peak grad: 15.4 mmHg
Ao pk vel: 1.97 m/s
Area-P 1/2: 3.12 cm2
MV M vel: 5.22 m/s
MV Peak grad: 109 mmHg
P 1/2 time: 457 msec
S' Lateral: 3.1 cm

## 2022-08-24 ENCOUNTER — Other Ambulatory Visit: Payer: Self-pay

## 2022-08-24 DIAGNOSIS — I4891 Unspecified atrial fibrillation: Secondary | ICD-10-CM

## 2022-08-24 DIAGNOSIS — I77810 Thoracic aortic ectasia: Secondary | ICD-10-CM

## 2022-08-24 DIAGNOSIS — I712 Thoracic aortic aneurysm, without rupture, unspecified: Secondary | ICD-10-CM

## 2022-08-24 DIAGNOSIS — I350 Nonrheumatic aortic (valve) stenosis: Secondary | ICD-10-CM

## 2022-08-24 MED ORDER — AMLODIPINE BESYLATE 5 MG PO TABS: 5.0000 mg | ORAL_TABLET | Freq: Every day | ORAL | 2 refills | Status: DC

## 2022-10-01 ENCOUNTER — Other Ambulatory Visit: Payer: Self-pay

## 2022-10-01 ENCOUNTER — Ambulatory Visit
Admission: EM | Admit: 2022-10-01 | Discharge: 2022-10-01 | Disposition: A | Discharge status: Home / Self Care | Payer: 59 | Attending: Internal Medicine | Admitting: Internal Medicine

## 2022-10-01 ENCOUNTER — Encounter: Payer: Self-pay | Admitting: *Deleted

## 2022-10-01 DIAGNOSIS — R21 Rash and other nonspecific skin eruption: Secondary | ICD-10-CM | POA: Diagnosis not present

## 2022-10-01 DIAGNOSIS — R220 Localized swelling, mass and lump, head: Secondary | ICD-10-CM | POA: Diagnosis not present

## 2022-10-01 MED ORDER — VALACYCLOVIR HCL 1 G PO TABS: 1000.0000 mg | ORAL_TABLET | Freq: Three times a day (TID) | ORAL | 0 refills | Status: AC

## 2022-10-01 MED ORDER — AMOXICILLIN-POT CLAVULANATE 875-125 MG PO TABS: 1.0000 | ORAL_TABLET | Freq: Two times a day (BID) | ORAL | 0 refills | Status: AC

## 2022-10-01 NOTE — ED Triage Notes (Signed)
Pt reports swelling right side of neck x 1 week. States he thought it was a bug bite and put alcohol on it

## 2022-10-01 NOTE — Discharge Instructions (Signed)
I have prescribed antibiotic given concern of salivary gland infection as well as Valtrex to treat concern for shingles.  Blood work is pending to rule out any worrisome causes.  We will call if there are any abnormal results.  Please monitor closely and follow-up if symptoms persist or worsen.

## 2022-10-01 NOTE — ED Provider Notes (Signed)
[facility]    CSN: [id number] Arrival date & time: 10/01/22  1328      History   Chief Complaint Chief Complaint  Patient presents with   Insect Bite    HPI David Stephens is a 67 y.o. male.   Patient presents with concerns of insect bites to his right neck.  States he did not witness or see anything bite him.  He has put alcohol on the area with minimal improvement.  Denies any fever, body aches, chills.     Past Medical History:  Diagnosis Date   Anginal pain (HCC)    Arthritis    Chest/Aorta CTA 11/2020   CT 11/22: normal sized aorta; no sig coronary artery Ca2+   Hyperlipidemia    patient states he takes medication for this but ran out and has stopped it.   Hypertension    Inguinal hernia    Kidney stones    PONV (postoperative nausea and vomiting)    Umbilical hernia     Patient Active Problem List   Diagnosis Date Noted   Hypercoagulable state due to persistent atrial fibrillation (HCC) 03/08/2022   Persistent atrial fibrillation (HCC) 03/08/2022   Plantar fasciitis 09/06/2016   Primary localized osteoarthritis of right knee 10/09/2014   Pre-operative cardiovascular examination 10/04/2014   Decreased cardiac ejection fraction 10/04/2014   Essential hypertension 10/04/2014    Past Surgical History:  Procedure Laterality Date   BUNIONECTOMY     CARDIOVERSION N/A 03/28/2022   Procedure: CARDIOVERSION;  Surgeon: Lewayne Bunting, MD;  Location: [facility] ENDOSCOPY;  Service: Cardiovascular;  Laterality: N/A;   COLONOSCOPY     HERNIA REPAIR     KNEE SURGERY Bilateral    several   multiple tooth extraction     NM MYOVIEW LTD  February 2009   No reversal perfusion defect to suggest ischemia. No infarct. Mild global hypokinesis with EF 47%.   TOTAL KNEE ARTHROPLASTY Right 10/09/2014   TOTAL KNEE ARTHROPLASTY Right 10/09/2014   Procedure: TOTAL KNEE ARTHROPLASTY;  Surgeon: Frederico Hamman, MD;  [facility];  Service: Orthopedics;  Laterality:  Right;       Home Medications    Prior to Admission medications   Medication Sig Start Date End Date Taking? Authorizing Provider  amLODipine (NORVASC) 5 MG tablet Take 1 tablet (5 mg total) by mouth daily. 08/24/22  Yes Meriam Sprague, MD  amoxicillin-clavulanate (AUGMENTIN) 875-125 MG tablet Take 1 tablet by mouth every 12 (twelve) hours. 10/01/22  Yes Eloyse Causey, Acie Fredrickson, FNP  apixaban (ELIQUIS) 5 MG TABS tablet Take 1 tablet (5 mg total) by mouth 2 (two) times daily. 02/22/22  Yes Meriam Sprague, MD  atorvastatin (LIPITOR) 40 MG tablet Take 1 tablet (40 mg total) by mouth daily. 11/30/21  Yes Meriam Sprague, MD  Cholecalciferol (VITAMIN D3) 10 MCG (400 UNIT) tablet Take 400 Units by mouth daily.   Yes [provider]  losartan (COZAAR) 50 MG tablet Take 1 tablet (50 mg total) by mouth daily. 02/22/22  Yes Meriam Sprague, MD  metoprolol tartrate (LOPRESSOR) 50 MG tablet Take 1 tablet (50 mg total) by mouth 2 (two) times daily. 05/29/22  Yes Meriam Sprague, MD  Multiple Vitamin (MULTIVITAMIN) tablet Take 1 tablet by mouth daily.   Yes [provider]  valACYclovir (VALTREX) 1000 MG tablet Take 1 tablet (1,000 mg total) by mouth 3 (three) times daily for 7 days. 10/01/22 10/08/22 Yes Cayden Rautio, Acie Fredrickson, FNP  amoxicillin (AMOXIL) 500 MG  tablet Take 2,000 mg by mouth once.    [provider]    Family History History reviewed. No pertinent family history.  Social History Social History   Tobacco Use   Smoking status: Never   Smokeless tobacco: Never   Tobacco comments:    Never smoke 03/08/22   Substance Use Topics   Alcohol use: No   Drug use: No     Allergies   Clindamycin/lincomycin   Review of Systems Review of Systems Per HPI  Physical Exam Triage Vital Signs ED Triage Vitals  Encounter Vitals Group     BP 10/01/22 1343 131/69     Systolic BP Percentile --      Diastolic BP Percentile --      Pulse Rate 10/01/22 1343 71      Resp 10/01/22 1343 18     Temp 10/01/22 1343 97.9 F (36.6 C)     Temp Source 10/01/22 1343 Oral     SpO2 10/01/22 1343 97 %     Weight --      Height --      Head Circumference --      Peak Flow --      Pain Score 10/01/22 1341 2     Pain Loc --      Pain Education --      Exclude from Growth Chart --    No data found.  Updated Vital Signs BP 131/69 (BP Location: Left Arm)   Pulse 71   Temp 97.9 F (36.6 C) (Oral)   Resp 18   SpO2 97%   Visual Acuity Right Eye Distance:   Left Eye Distance:   Bilateral Distance:    Right Eye Near:   Left Eye Near:    Bilateral Near:     Physical Exam Constitutional:      General: He is not in acute distress.    Appearance: Normal appearance. He is not toxic-appearing or diaphoretic.  HENT:     Head: Normocephalic and atraumatic.      Comments: Patient has area of swelling that is approximately 2.5 cm in diameter with no discoloration present to right lateral jaw.  No lesions overlying.    Right Ear: Hearing, tympanic membrane, ear canal and external ear normal.     Mouth/Throat:     Comments: Inner mouth normal.  Eyes:     Extraocular Movements: Extraocular movements intact.     Conjunctiva/sclera: Conjunctivae normal.  Pulmonary:     Effort: Pulmonary effort is normal.  Skin:    Comments: Has scattered groupings of erythematous lesions with some of them being slightly vesicular starting at the right occipital portion that extends around to the lateral portion of the neck and into the upper chest.  No drainage noted.  Neurological:     General: No focal deficit present.     Mental Status: He is alert and oriented to person, place, and time. Mental status is at baseline.  Psychiatric:        Mood and Affect: Mood normal.        Behavior: Behavior normal.        Thought Content: Thought content normal.        Judgment: Judgment normal.      UC Treatments / Results  Labs (all labs ordered are listed, but only abnormal  results are displayed) Labs Reviewed  CBC  BASIC METABOLIC PANEL    EKG   Radiology No results found.  Procedures Procedures (including critical care  time)  Medications Ordered in UC Medications - No data to display  Initial Impression / Assessment and Plan / UC Course  I have reviewed the triage vital signs and the nursing notes.  Pertinent labs & imaging results that were available during my care of the patient were reviewed by me and considered in my medical decision making (see chart for details).     I suspect that patient's symptoms and physical exam could be multifactorial.  The rash that is present surrounding the neck appears to be suspicious for herpes zoster.  Therefore, will opt to treat with Valtrex.  Most recent creatinine clearance that I can see is 77 so no dosage adjustment necessary for this.  Other differentials does include contact dermatitis versus insect bite.  This swelling on the side of the patient's jaw could be related to lymph node swelling versus rash but I am most suspicious that patient could have parotitis so will treat with Augmentin.  No obvious signs of airway compromise.  Given how diffuse the patient's symptoms are, will obtain BMP and CBC as well to rule any worrisome abnormality.  Patient was advised of the importance of monitoring symptoms and following up closely if symptoms persist or worsen.  Patient verbalized understanding and was agreeable with plan. Final Clinical Impressions(s) / UC Diagnoses   Final diagnoses:  Facial swelling  Rash and nonspecific skin eruption     Discharge Instructions      I have prescribed antibiotic given concern of salivary gland infection as well as Valtrex to treat concern for shingles.  Blood work is pending to rule out any worrisome causes.  We will call if there are any abnormal results.  Please monitor closely and follow-up if symptoms persist or worsen.    ED Prescriptions     Medication Sig  Dispense Auth. Provider   amoxicillin-clavulanate (AUGMENTIN) 875-125 MG tablet Take 1 tablet by mouth every 12 (twelve) hours. 14 tablet Excel, [place], [place]   valACYclovir (VALTREX) 1000 MG tablet Take 1 tablet (1,000 mg total) by mouth 3 (three) times daily for 7 days. 21 tablet Rudolph, Acie Fredrickson, [place]      PDMP not reviewed this encounter.   Gustavus Bryant, [place] 10/01/22 [phone]

## 2022-10-02 LAB — BASIC METABOLIC PANEL
BUN/Creatinine Ratio: 14 (ref 10–24)
BUN: 22 mg/dL (ref 8–27)
CO2: 20 mmol/L (ref 20–29)
Calcium: 9.2 mg/dL (ref 8.6–10.2)
Chloride: 109 mmol/L — ABNORMAL HIGH (ref 96–106)
Creatinine, Ser: 1.56 mg/dL — ABNORMAL HIGH (ref 0.76–1.27)
Glucose: 104 mg/dL — ABNORMAL HIGH (ref 70–99)
Potassium: 4.3 mmol/L (ref 3.5–5.2)
Sodium: 144 mmol/L (ref 134–144)
eGFR: 48 mL/min/{1.73_m2} — ABNORMAL LOW (ref >59)

## 2022-10-02 LAB — CBC
Hematocrit: 43.3 % (ref 37.5–51.0)
Hemoglobin: 13.7 g/dL (ref 13.0–17.7)
MCH: 28.4 pg (ref 26.6–33.0)
MCHC: 31.6 g/dL (ref 31.5–35.7)
MCV: 90 fL (ref 79–97)
Platelets: 145 10*3/uL — ABNORMAL LOW (ref 150–450)
RBC: 4.82 x10E6/uL (ref 4.14–5.80)
RDW: 13.1 % (ref 11.6–15.4)
WBC: 6.9 10*3/uL (ref 3.4–10.8)

## 2022-10-24 ENCOUNTER — Telehealth: Payer: Self-pay | Admitting: Cardiovascular Disease

## 2022-10-24 NOTE — Telephone Encounter (Signed)
*  STAT* If patient is at the pharmacy, call can be transferred to refill team.   1. Which medications need to be refilled? (please list name of each medication and dose if known) apixaban (ELIQUIS) 5 MG TABS tablet    2. Would you like to learn more about the convenience, safety, & potential cost savings by using the [facility]? No   3. Are you open to using the [facility] No   4. Which pharmacy/location (including street and city if local pharmacy) is medication to be sent to?[id number] - [place] - [address]     5. Do they need a 30 day or 90 day supply? 90 Day Supply   Pt is currently out of the medication. Patient will see Dr. Excell Seltzer on 10/17.

## 2022-10-25 ENCOUNTER — Other Ambulatory Visit: Payer: Self-pay | Admitting: *Deleted

## 2022-10-25 DIAGNOSIS — I4891 Unspecified atrial fibrillation: Secondary | ICD-10-CM

## 2022-10-25 MED ORDER — APIXABAN 5 MG PO TABS: 5.0000 mg | ORAL_TABLET | Freq: Two times a day (BID) | ORAL | 1 refills | Status: DC

## 2022-10-25 NOTE — Telephone Encounter (Signed)
Eliquis 5mg  refill request received. Patient is 67 years old, weight-123.7kg, Crea-1.56 on 10/01/22, Diagnosis-Afib, and last seen by Dr. Laurance Flatten on 05/23/22 and will follow with Dr. Excell Seltzer on 11/09/22. Dose is appropriate based on dosing criteria. Will send in refill to requested pharmacy.

## 2022-10-26 ENCOUNTER — Telehealth: Payer: Self-pay | Admitting: Cardiovascular Disease

## 2022-10-26 ENCOUNTER — Ambulatory Visit: Payer: 59 | Admitting: Cardiovascular Disease

## 2022-10-26 NOTE — Telephone Encounter (Signed)
Patient calling the office for samples of medication: ° ° °1.  What medication and dosage are you requesting samples for? °Eliquis 5 mg  °2.  Are you currently out of this medication?  °Yes ° ° ° °

## 2022-10-26 NOTE — Telephone Encounter (Signed)
Called and spoke with patient to inform him that Eliquis prescription was sent into pharmacy for him yesterday by anticoag clinic. [facility] and asked [place] for samples. Explained the sample policy and that he can transfer to a different drug store, he states [facility] will [facility].

## 2022-11-09 ENCOUNTER — Ambulatory Visit: Payer: 59 | Attending: Cardiology | Admitting: Cardiovascular Disease

## 2022-11-09 ENCOUNTER — Encounter: Payer: Self-pay | Admitting: Cardiovascular Disease

## 2022-11-09 VITALS — BP 132/82 | HR 55 | Resp 16 | Ht 72.0 in | Wt 264.6 lb

## 2022-11-09 DIAGNOSIS — D6869 Other thrombophilia: Secondary | ICD-10-CM

## 2022-11-09 DIAGNOSIS — I10 Essential (primary) hypertension: Secondary | ICD-10-CM

## 2022-11-09 DIAGNOSIS — I4819 Other persistent atrial fibrillation: Secondary | ICD-10-CM

## 2022-11-09 DIAGNOSIS — I350 Nonrheumatic aortic (valve) stenosis: Secondary | ICD-10-CM

## 2022-11-09 NOTE — Patient Instructions (Signed)
Medication Instructions:  Your physician recommends that you continue on your current medications as directed. Please refer to the Current Medication list given to you today. *If you need a refill on your cardiac medications before your next appointment, please call your pharmacy*  Follow-Up: At [facility], you and your health needs are our priority.  As part of our continuing mission to provide you with exceptional heart care, we have created designated Provider Care Teams.  These Care Teams include your primary Cardiologist (physician) and Advanced Practice Providers (APPs -  Physician Assistants and Nurse Practitioners) who all work together to provide you with the care you need, when you need it.  Your next appointment:   6 month(s)  Provider:   Jari Favre, PA-C, Ronie Spies, PA-C, Robin Searing, NP, Eligha Bridegroom, NP, Tereso Newcomer, PA-C, or Perlie Gold, PA-C     Then, Tonny Bollman, MD will plan to see you again in 1 year(s).

## 2022-11-09 NOTE — Progress Notes (Signed)
Cardiology Office Note:    Date:  11/09/2022   ID:  David Stephens, David Stephens November 30, 1955, MRN [id number]  PCP:  [facility] Providers Cardiologist:  Tonny Bollman, MD     Referring MD: [facility]*   Chief Complaint  Patient presents with   Atrial Fibrillation    History of Present Illness:    David Stephens is a 67 y.o. male presenting for cardiology follow-up evaluation.  Has been followed by Dr. Shari Prows and I will be assuming his care in Dr. Devin Going departure.  He has a history of hypertension, severe LVH, mild aortic stenosis, and persistent atrial fibrillation status post successful cardioversion in March 2024.  The patient is here alone today.  He has been doing very well.  He denies any chest pain, chest pressure, shortness of breath, heart palpitations, fatigue, lightheadedness, or syncope.  He has not developed any symptoms like those he experienced when he was in atrial fibrillation.  He remains compliant with his medications.  He denies any bleeding problems on apixaban.  Past Medical History:  Diagnosis Date   Anginal pain (HCC)    Arthritis    Chest/Aorta CTA 11/2020   CT 11/22: normal sized aorta; no sig coronary artery Ca2+   Hyperlipidemia    patient states he takes medication for this but ran out and has stopped it.   Hypertension    Inguinal hernia    Kidney stones    PONV (postoperative nausea and vomiting)    Umbilical hernia     Past Surgical History:  Procedure Laterality Date   BUNIONECTOMY     CARDIOVERSION N/A 03/28/2022   Procedure: CARDIOVERSION;  Surgeon: Lewayne Bunting, MD;  Location: [facility] ENDOSCOPY;  Service: Cardiovascular;  Laterality: N/A;   COLONOSCOPY     HERNIA REPAIR     KNEE SURGERY Bilateral    several   multiple tooth extraction     NM MYOVIEW LTD  February 2009   No reversal perfusion defect to suggest ischemia. No infarct. Mild global hypokinesis with EF  47%.   TOTAL KNEE ARTHROPLASTY Right 10/09/2014   TOTAL KNEE ARTHROPLASTY Right 10/09/2014   Procedure: TOTAL KNEE ARTHROPLASTY;  Surgeon: Frederico Hamman, MD;  Location: [facility] OR;  Service: Orthopedics;  Laterality: Right;    Current Medications: Current Meds  Medication Sig   amLODipine (NORVASC) 5 MG tablet Take 1 tablet (5 mg total) by mouth daily.   amoxicillin (AMOXIL) 500 MG tablet Take 2,000 mg by mouth once.   amoxicillin-clavulanate (AUGMENTIN) 875-125 MG tablet Take 1 tablet by mouth every 12 (twelve) hours.   apixaban (ELIQUIS) 5 MG TABS tablet Take 1 tablet (5 mg total) by mouth 2 (two) times daily.   atorvastatin (LIPITOR) 40 MG tablet Take 1 tablet (40 mg total) by mouth daily.   Cholecalciferol (VITAMIN D3) 10 MCG (400 UNIT) tablet Take 400 Units by mouth daily.   losartan (COZAAR) 50 MG tablet Take 1 tablet (50 mg total) by mouth daily.   metoprolol tartrate (LOPRESSOR) 50 MG tablet Take 1 tablet (50 mg total) by mouth 2 (two) times daily.   Multiple Vitamin (MULTIVITAMIN) tablet Take 1 tablet by mouth daily.     Allergies:   Clindamycin/lincomycin   Social History   Socioeconomic History   Marital status: Divorced    Spouse name: Not on file   Number of children: Not on file   Years of education: Not on file  Highest education level: Not on file  Occupational History   Not on file  Tobacco Use   Smoking status: Never   Smokeless tobacco: Never   Tobacco comments:    Never smoke 03/08/22   Substance and Sexual Activity   Alcohol use: No   Drug use: No   Sexual activity: Not on file  Other Topics Concern   Not on file  Social History Narrative   Not on file   Social Determinants of Health   Financial Resource Strain: Not on file  Food Insecurity: Not on file  Transportation Needs: Not on file  Physical Activity: Not on file  Stress: Not on file  Social Connections: Not on file     Family History: The patient's family history is not on file.  ROS:    Please see the history of present illness.    All other systems reviewed and are negative.  EKGs/Labs/Other Studies Reviewed:    The following studies were reviewed today: Echocardiogram June 2024:  1. Left ventricular ejection fraction, by estimation, is 55 to 60%. The  left ventricle has normal function. The left ventricle has no regional  wall motion abnormalities. There is moderate left ventricular hypertrophy.  Left ventricular diastolic  parameters are consistent with Grade II diastolic dysfunction  (pseudonormalization).   2. Right ventricular systolic function is normal. The right ventricular  size is normal.   3. Left atrial size was moderately dilated.   4. The mitral valve is degenerative. Mild mitral valve regurgitation. No  evidence of mitral stenosis.   5. The aortic valve is tricuspid. There is mild calcification of the  aortic valve. There is mild thickening of the aortic valve. Aortic valve  regurgitation is mild. Aortic valve sclerosis/calcification is present,  without any evidence of aortic  stenosis.   6. Aortic dilatation noted. There is borderline dilatation of the  ascending aorta, measuring 38 mm.   7. The inferior vena cava is normal in size with greater than 50%  respiratory variability, suggesting right atrial pressure of 3 mmHg.       Recent Labs: 10/01/2022: BUN 22; Creatinine, Ser 1.56; Hemoglobin 13.7; Platelets 145; Potassium 4.3; Sodium 144  Recent Lipid Panel No results found for: "CHOL", "TRIG", "HDL", "CHOLHDL", "VLDL", "LDLCALC", "LDLDIRECT"   Risk Assessment/Calculations:    CHA2DS2-VASc Score = 2   This indicates a 2.2% annual risk of stroke. The patient's score is based upon: CHF History: 0 HTN History: 0 Diabetes History: 0 Stroke History: 0 Vascular Disease History: 1 Age Score: 1 Gender Score: 0               Physical Exam:    VS:  BP 132/82 (BP Location: Right Arm, Patient Position: Sitting, Cuff Size: Large)    Pulse (!) 55   Resp 16   Ht 6' (1.829 m)   Wt 264 lb 9.6 oz (120 kg)   SpO2 95%   BMI 35.89 kg/m     Wt Readings from Last 3 Encounters:  11/09/22 264 lb 9.6 oz (120 kg)  05/23/22 272 lb 9.6 oz (123.7 kg)  04/12/22 273 lb 3.2 oz (123.9 kg)     GEN:  Well nourished, well developed in no acute distress HEENT: Normal NECK: No JVD; No carotid bruits LYMPHATICS: No lymphadenopathy CARDIAC: RRR, 2/6 systolic murmur at the right upper sternal border RESPIRATORY:  Clear to auscultation without rales, wheezing or rhonchi  ABDOMEN: Soft, non-tender, non-distended MUSCULOSKELETAL:  No edema; No deformity  SKIN: Warm  and dry NEUROLOGIC:  Alert and oriented x 3 PSYCHIATRIC:  Normal affect   ASSESSMENT:    1. Mild aortic stenosis   2. Persistent atrial fibrillation (HCC)   3. Essential hypertension   4. Hypercoagulable state due to persistent atrial fibrillation (HCC)    PLAN:    In order of problems listed above:  Asymptomatic, mild aortic stenosis by echo and physical exam.  Continue clinical follow-up.  Recommend APP follow-up in 6 months and I will plan to see back in 1 year. Appears to be maintaining sinus rhythm on metoprolol tartrate.  Patient status post cardioversion earlier this year.  He continues on apixaban for anticoagulation.  LVEF normalized with cardioversion and maintenance of sinus rhythm.  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 Blood pressure well-controlled on multidrug therapy with amlodipine, losartan, and metoprolol.  Most recent labs are reviewed with a creatinine of 1.56 and potassium of 4.3. Tolerating anticoagulation with apixaban.  Recent hemoglobin of 14.6 noted.  No bleeding problems reported.           Medication Adjustments/Labs and Tests Ordered: Current medicines are reviewed at length with the patient today.   Concerns regarding medicines are outlined above.  No orders of the defined types were placed in this encounter.  No orders of the defined types were placed in this encounter.   Patient Instructions  Medication Instructions:  Your physician recommends that you continue on your current medications as directed. Please refer to the Current Medication list given to you today. *If you need a refill on your cardiac medications before your next appointment, please call your pharmacy*  Follow-Up: At [facility], you and your health needs are our priority.  As part of our continuing mission to provide you with exceptional heart care, we have created designated Provider Care Teams.  These Care Teams include your primary Cardiologist (physician) and Advanced Practice Providers (APPs -  Physician Assistants and Nurse Practitioners) who all work together to provide you with the care you need, when you need it.  Your next appointment:   6 month(s)  Provider:   Jari Favre, PA-C, Ronie Spies, PA-C, Robin Searing, NP, Eligha Bridegroom, NP, Tereso Newcomer, PA-C, or Perlie Gold, PA-C     Then, Tonny Bollman, MD will plan to see you again in 1 year(s).       Signed, Tonny Bollman, MD  11/09/2022 5:11 PM    [facility]

## 2022-11-20 ENCOUNTER — Ambulatory Visit: Payer: 59 | Admitting: Cardiology

## 2023-02-14 ENCOUNTER — Other Ambulatory Visit: Payer: Self-pay

## 2023-02-14 DIAGNOSIS — I712 Thoracic aortic aneurysm, without rupture, unspecified: Secondary | ICD-10-CM

## 2023-02-14 DIAGNOSIS — I4891 Unspecified atrial fibrillation: Secondary | ICD-10-CM

## 2023-02-14 DIAGNOSIS — I350 Nonrheumatic aortic (valve) stenosis: Secondary | ICD-10-CM

## 2023-02-14 DIAGNOSIS — I77810 Thoracic aortic ectasia: Secondary | ICD-10-CM

## 2023-02-14 MED ORDER — LOSARTAN POTASSIUM 50 MG PO TABS: 50.0000 mg | ORAL_TABLET | Freq: Every day | ORAL | 2 refills | Status: DC

## 2023-04-25 NOTE — Progress Notes (Unsigned)
 Cardiology Office Note    Patient Name: David Stephens Date of Encounter: 04/26/2023  Primary Care Provider:  [facility] [place] Primary Cardiologist:  Tonny Bollman, MD Primary Electrophysiologist: None   Past Medical History    Past Medical History:  Diagnosis Date   Anginal pain [facility])    Arthritis    Chest/Aorta CTA 11/2020   CT 11/22: normal sized aorta; no sig coronary artery Ca2+   Hyperlipidemia    patient states he takes medication for this but ran out and has stopped it.   Hypertension    Inguinal hernia    Kidney stones    PONV (postoperative nausea and vomiting)    Umbilical hernia     History of Present Illness  David Stephens is a 68 y.o. male with a PMH of HTN, severe LVH, mild aortic stenosis, HLD, CKD stage IIIb, PVCs and ascending aorta persistent AF who presents today for 62-month follow-up.  David Stephens was seen initially in 2016 for medical clearance for knee surgery.  He completed a previous nuclear stress test in 2009 that was normal.  He went a 2D echo that showed EF of 60 to 65% with moderate LVH and mild to moderate LA enlargement-DD.  He was referred for abnormal TTE and heart murmur by PCP and was seen by Dr. Shari Prows in 2021.  Mild aortic stenosis with a mean gradient of 17.7 mmHg and moderate to severe LVH due to poorly controlled BP.  He was seen in follow-up on 02/22/2022 with new onset AF was started on apixaban and metoprolol and referred to AF clinic arrangement of DCCV.  Went to success for DCCV on 03/28/2022 with conversion to sinus rhythm.  He was last seen by Dr. Shari Prows on 05/23/2022 and reported feeling well overall.  He was seen and reestablished with Dr. Excell Seltzer in 10/2022 and reported compliance with medication with blood pressure controlled at 132/82.  He was maintaining sinus and TTE was completed on 06/2022 showing improved EF of 55 to 60% with moderate LVH and grade 2 DD with moderately dilated LA and mild MR  borderline dilation of the ascending aorta at 38 mm.  David Stephens presents today for 89-month follow-up. He reports recently experiencing numbness and pain in his hands, which began last September during an illness. Initially affecting the right hand, the symptoms have now spread to the left hand. He describes the sensation as numbness from the fingers down, akin to the hand being 'asleep'. The symptoms have persisted without resolution. He associates the onset with episodes of illness, including a recent episode two to three weeks ago with diarrhea, cold, chills, sneezing, and coughing. No history of carpal tunnel syndrome, but he uses his hands for typing at work. He has not used braces for his hands.  His blood pressure was mildly elevated at 140/80 and patient reports blood pressures are controlled at home in the 130s systolically.  He is compliant with his current medications and denies any adverse reactions.  He reports no episodes of atrial fibrillation or tachycardia and denies any bleeding with Eliquis. He has not been evaluated for sleep apnea but reports loud snoring and daytime fatigue. He is not interested in pursuing a sleep study at this time. Patient denies chest pain, palpitations, dyspnea, PND, orthopnea, nausea, vomiting, dizziness, syncope, edema, weight gain, or early satiety.   Discussed the use of AI scribe software for clinical note transcription with the patient, who gave verbal consent to proceed.  History of Present Illness    Review of Systems  Please see the history of present illness.    All other systems reviewed and are otherwise negative except as noted above.  Physical Exam    Wt Readings from Last 3 Encounters:  04/26/23 262 lb (118.8 kg)  11/09/22 264 lb 9.6 oz (120 kg)  05/23/22 272 lb 9.6 oz (123.7 kg)   VS: Vitals:   04/26/23 1535  BP: (!) 140/80  Pulse: 69  SpO2: 97%  ,Body mass index is 34.57 kg/m. GEN: Well nourished, well developed in no acute  distress Neck: No JVD; No carotid bruits Pulmonary: Clear to auscultation without rales, wheezing or rhonchi  Cardiovascular: Normal rate. Regular rhythm. Normal S1. Normal S2.   Murmurs: There is no murmur.  ABDOMEN: Soft, non-tender, non-distended EXTREMITIES:  No edema; No deformity   EKG/LABS/ Recent Cardiac Studies   ECG personally reviewed by me today -sinus rhythm with rate of 69 bpm and no acute changes with apparent conduction and premature atrial complex  Risk Assessment/Calculations:    CHA2DS2-VASc Score = 5   This indicates a 7.2% annual risk of stroke. The patient's score is based upon: CHF History: 1 HTN History: 1 Diabetes History: 1 Stroke History: 0 Vascular Disease History: 1 Age Score: 1 Gender Score: 0     STOP-Bang Score:  6      Lab Results  Component Value Date   WBC 6.9 10/01/2022   HGB 13.7 10/01/2022   HCT 43.3 10/01/2022   MCV 90 10/01/2022   PLT 145 (L) 10/01/2022   Lab Results  Component Value Date   CREATININE 1.56 (H) 10/01/2022   BUN 22 10/01/2022   NA 144 10/01/2022   K 4.3 10/01/2022   CL 109 (H) 10/01/2022   CO2 20 10/01/2022   No results found for: "CHOL", "HDL", "LDLCALC", "LDLDIRECT", "TRIG", "CHOLHDL"  No results found for: "HGBA1C" Assessment & Plan    1.  Persistent AF: -Rate currently controlled and sinus rhythm -Continue metoprolol 50 mg twice daily -Patient's last hemoglobin was 14.6 and creatinine was 1.5 -Continue Eliquis 5 mg twice daily  2.  Essential hypertension: -Patient's blood pressure was mildly elevated at 140/80 -Patient reports control at with systolic in the 130s -Continue metoprolol 50 mg twice daily, Norvasc 5 mg daily and losartan 50 mg daily  3.  Hyperlipidemia: -Patient's latest lipid panel will be requested from PCP -Continue Lipitor 40 mg daily  4.  Ascending aortic aneurysm: -Mild aortic dilation at 3.8 cm. Blood pressure control crucial to prevent further dilation. - Maintain blood  pressure control to prevent progression.  5.  CKD stage IIIb - Monitor kidney function regularly. - Check creatinine levels to adjust Eliquis dosage if necessary.  Disposition: Follow-up with Tonny Bollman, MD or APP in 6 months    Signed, Napoleon Form, Leodis Rains, NP 04/26/2023, 6:10 PM [facility]

## 2023-04-26 ENCOUNTER — Encounter: Payer: Self-pay | Admitting: Nurse Practitioner

## 2023-04-26 ENCOUNTER — Ambulatory Visit: Payer: 59 | Attending: Nurse Practitioner | Admitting: Nurse Practitioner

## 2023-04-26 VITALS — BP 140/80 | HR 69 | Ht 73.0 in | Wt 262.0 lb

## 2023-04-26 DIAGNOSIS — I4891 Unspecified atrial fibrillation: Secondary | ICD-10-CM

## 2023-04-26 DIAGNOSIS — I4819 Other persistent atrial fibrillation: Secondary | ICD-10-CM | POA: Diagnosis not present

## 2023-04-26 DIAGNOSIS — I10 Essential (primary) hypertension: Secondary | ICD-10-CM

## 2023-04-26 DIAGNOSIS — I712 Thoracic aortic aneurysm, without rupture, unspecified: Secondary | ICD-10-CM

## 2023-04-26 DIAGNOSIS — E7800 Pure hypercholesterolemia, unspecified: Secondary | ICD-10-CM

## 2023-04-26 DIAGNOSIS — I77810 Thoracic aortic ectasia: Secondary | ICD-10-CM

## 2023-04-26 DIAGNOSIS — I350 Nonrheumatic aortic (valve) stenosis: Secondary | ICD-10-CM

## 2023-04-26 MED ORDER — LOSARTAN POTASSIUM 50 MG PO TABS: 50.0000 mg | ORAL_TABLET | Freq: Every day | ORAL | 2 refills | Status: AC

## 2023-04-26 MED ORDER — ATORVASTATIN CALCIUM 40 MG PO TABS: 40.0000 mg | ORAL_TABLET | Freq: Every day | ORAL | 2 refills | Status: DC

## 2023-04-26 MED ORDER — METOPROLOL TARTRATE 50 MG PO TABS: 50.0000 mg | ORAL_TABLET | Freq: Two times a day (BID) | ORAL | 2 refills | Status: AC

## 2023-04-26 MED ORDER — AMLODIPINE BESYLATE 5 MG PO TABS: 5.0000 mg | ORAL_TABLET | Freq: Every day | ORAL | 2 refills | Status: AC

## 2023-04-26 MED ORDER — APIXABAN 5 MG PO TABS: 5.0000 mg | ORAL_TABLET | Freq: Two times a day (BID) | ORAL | 2 refills | Status: DC

## 2023-04-26 NOTE — Patient Instructions (Signed)
 Medication Instructions:  Your physician recommends that you continue on your current medications as directed. Please refer to the Current Medication list given to you today. *If you need a refill on your cardiac medications before your next appointment, please call your pharmacy*  Lab Work: None ordered If you have labs (blood work) drawn today and your tests are completely normal, you will receive your results only by: MyChart Message (if you have MyChart) OR A paper copy in the mail If you have any lab test that is abnormal or we need to change your treatment, we will call you to review the results.  Testing/Procedures: None ordered  Follow-Up: At [facility], you and your health needs are our priority.  As part of our continuing mission to provide you with exceptional heart care, our providers are all part of one team.  This team includes your primary Cardiologist (physician) and Advanced Practice Providers or APPs (Physician Assistants and Nurse Practitioners) who all work together to provide you with the care you need, when you need it.  Your next appointment:   6 month(s)  Provider:   Tonny Bollman, MD or Robin Searing, NP   We recommend signing up for the patient portal called "MyChart".  Sign up information is provided on this After Visit Summary.  MyChart is used to connect with patients for Virtual Visits (Telemedicine).  Patients are able to view lab/test results, encounter notes, upcoming appointments, etc.  Non-urgent messages can be sent to your provider as well.   To learn more about what you can do with MyChart, go to [email or web address].   Other Instructions       1st Floor: - Lobby - Registration  - Pharmacy  - Lab - Cafe  2nd Floor: - PV Lab - Diagnostic Testing (echo, CT, nuclear med)  3rd Floor: - Vacant  4th Floor: - TCTS (cardiothoracic surgery) - AFib Clinic - Structural Heart Clinic - Vascular Surgery  - Vascular  Ultrasound  5th Floor: - HeartCare Cardiology (general and EP) - Clinical Pharmacy for coumadin, hypertension, lipid, weight-loss medications, and med management appointments    Valet parking services will be available as well.

## 2023-06-27 ENCOUNTER — Telehealth: Payer: Self-pay | Admitting: Cardiovascular Disease

## 2023-06-27 NOTE — Telephone Encounter (Signed)
 Pharmacy please advise on holding Eliquis  prior to left carpal tunnel and cubical tunnel release scheduled for 07/10/2023. Thank you.

## 2023-06-27 NOTE — Telephone Encounter (Signed)
   Patient Name: David Stephens  DOB: September 05, 1955 MRN: [id number]  Primary Cardiologist: Arnoldo Lapping, MD  Clinical pharmacists have reviewed the patient's past medical history, labs, and current medications as part of preoperative protocol coverage. The following recommendations have been made:  Per office protocol, patient can hold Eliquis  for 3 days prior to procedure.   Patient will not need bridging with Lovenox  (enoxaparin ) around procedure.   I will route this recommendation to the requesting party via Epic fax function and remove from pre-op pool.  Please call with questions.  Francene Ing, Retha Cast, NP 06/27/2023, 12:38 PM

## 2023-06-27 NOTE — Telephone Encounter (Signed)
   Pre-operative Risk Assessment    Patient Name: David Stephens  DOB: 09-20-1955 MRN: [id number]      Request for Surgical Clearance    Procedure:  Left Carpal Tunel and Cubicul Tunnel Release  Date of Surgery:  Clearance 07/10/23                                 Surgeon:  Dr, Elicia Ground Surgeon's Group or Practice Name:  [facility] Phone number:  [phone] Fax number:  [phone]   Type of Clearance Requested:   - Pharmacy:  Hold Apixaban  (Eliquis ) Hold for 3 days before   Type of Anesthesia:  Choice   Additional requests/questions:    SignedBerniece Brisk   06/27/2023, 10:31 AM

## 2023-06-27 NOTE — Telephone Encounter (Signed)
 Patient with diagnosis of afib on Eliquis  for anticoagulation.    Procedure: Left Carpal Tunel and Cubicul Tunnel Release  Date of procedure: 07/10/2023   CHA2DS2-VASc Score = 5   This indicates a 7.2% annual risk of stroke. The patient's score is based upon: CHF History: 1 HTN History: 1 Diabetes History: 1 Stroke History: 0 Vascular Disease History: 1 Age Score: 1 Gender Score: 0     CrCl 61 mL/min Platelet count 145   Patient has not  had an Afib/aflutter ablation within the last 3 months or DCCV within the last 30 days   Per office protocol, patient can hold Eliquis  for 3 days prior to procedure.   Patient will not need bridging with Lovenox  (enoxaparin ) around procedure.  **This guidance is not considered finalized until pre-operative APP has relayed final recommendations.**

## 2023-11-12 ENCOUNTER — Telehealth: Payer: Self-pay | Admitting: Urology

## 2023-11-12 NOTE — Telephone Encounter (Signed)
 Pt called and lvm for a apt called pt back and lvm ANN 11/12/23 ANN

## 2024-01-30 ENCOUNTER — Other Ambulatory Visit: Payer: Self-pay | Admitting: Nurse Practitioner

## 2024-01-30 DIAGNOSIS — I4891 Unspecified atrial fibrillation: Secondary | ICD-10-CM

## 2024-02-22 ENCOUNTER — Other Ambulatory Visit: Payer: Self-pay | Admitting: Nurse Practitioner
